# Patient Record
Sex: Female | Born: 1975 | Race: White | Hispanic: Yes | Marital: Single | Smoking: Never smoker
Health system: Southern US, Community
[De-identification: ages and names within clinical notes are randomized; demographics above are authoritative.]

## PROBLEM LIST (undated history)

## (undated) DIAGNOSIS — F32A Depression, unspecified: Secondary | ICD-10-CM

## (undated) DIAGNOSIS — K831 Obstruction of bile duct: Secondary | ICD-10-CM

## (undated) DIAGNOSIS — O26619 Liver and biliary tract disorders in pregnancy, unspecified trimester: Secondary | ICD-10-CM

## (undated) DIAGNOSIS — J302 Other seasonal allergic rhinitis: Secondary | ICD-10-CM

## (undated) DIAGNOSIS — D649 Anemia, unspecified: Secondary | ICD-10-CM

## (undated) DIAGNOSIS — O09219 Supervision of pregnancy with history of pre-term labor, unspecified trimester: Secondary | ICD-10-CM

## (undated) DIAGNOSIS — K219 Gastro-esophageal reflux disease without esophagitis: Secondary | ICD-10-CM

## (undated) DIAGNOSIS — E785 Hyperlipidemia, unspecified: Secondary | ICD-10-CM

## (undated) DIAGNOSIS — O26649 Intrahepatic cholestasis of pregnancy, unspecified trimester: Secondary | ICD-10-CM

## (undated) DIAGNOSIS — F329 Major depressive disorder, single episode, unspecified: Secondary | ICD-10-CM

## (undated) HISTORY — DX: Depression, unspecified: F32.A

## (undated) HISTORY — DX: Anemia, unspecified: D64.9

## (undated) HISTORY — DX: Major depressive disorder, single episode, unspecified: F32.9

## (undated) HISTORY — DX: Hyperlipidemia, unspecified: E78.5

## (undated) HISTORY — DX: Supervision of pregnancy with history of pre-term labor, unspecified trimester: O09.219

## (undated) HISTORY — DX: Intrahepatic cholestasis of pregnancy, unspecified trimester: O26.649

## (undated) HISTORY — DX: Obstruction of bile duct: K83.1

## (undated) HISTORY — DX: Gastro-esophageal reflux disease without esophagitis: K21.9

## (undated) HISTORY — DX: Liver and biliary tract disorders in pregnancy, unspecified trimester: O26.619

## (undated) HISTORY — PX: GYNECOLOGIC CRYOSURGERY: SHX857

---

## 2003-08-14 ENCOUNTER — Inpatient Hospital Stay (HOSPITAL_COMMUNITY): Admission: AD | Admit: 2003-08-14 | Discharge: 2003-08-14 | Payer: Self-pay | Admitting: *Deleted

## 2003-08-26 ENCOUNTER — Ambulatory Visit (HOSPITAL_COMMUNITY): Admission: RE | Admit: 2003-08-26 | Discharge: 2003-08-26 | Payer: Self-pay | Admitting: *Deleted

## 2003-11-02 ENCOUNTER — Inpatient Hospital Stay (HOSPITAL_COMMUNITY): Admission: AD | Admit: 2003-11-02 | Discharge: 2003-11-04 | Payer: Self-pay | Admitting: *Deleted

## 2003-11-05 ENCOUNTER — Encounter: Admission: RE | Admit: 2003-11-05 | Discharge: 2003-12-05 | Payer: Self-pay | Admitting: *Deleted

## 2009-07-31 ENCOUNTER — Emergency Department (HOSPITAL_COMMUNITY): Admission: EM | Admit: 2009-07-31 | Discharge: 2009-07-31 | Payer: Self-pay | Admitting: Emergency Medicine

## 2010-09-07 LAB — URINALYSIS, ROUTINE W REFLEX MICROSCOPIC
Bilirubin Urine: NEGATIVE
Glucose, UA: NEGATIVE mg/dL
Nitrite: NEGATIVE
Specific Gravity, Urine: 1.018 (ref 1.005–1.030)
pH: 6 (ref 5.0–8.0)

## 2010-09-07 LAB — URINE CULTURE

## 2010-09-07 LAB — CBC
HCT: 34.6 % — ABNORMAL LOW (ref 36.0–46.0)
Hemoglobin: 11.6 g/dL — ABNORMAL LOW (ref 12.0–15.0)
WBC: 11.3 10*3/uL — ABNORMAL HIGH (ref 4.0–10.5)

## 2010-09-07 LAB — COMPREHENSIVE METABOLIC PANEL
Alkaline Phosphatase: 71 U/L (ref 39–117)
BUN: 7 mg/dL (ref 6–23)
CO2: 25 mEq/L (ref 19–32)
Chloride: 103 mEq/L (ref 96–112)
Glucose, Bld: 87 mg/dL (ref 70–99)
Potassium: 3.9 mEq/L (ref 3.5–5.1)
Total Bilirubin: 0.5 mg/dL (ref 0.3–1.2)

## 2010-09-07 LAB — DIFFERENTIAL
Basophils Absolute: 0.1 10*3/uL (ref 0.0–0.1)
Basophils Relative: 1 % (ref 0–1)
Monocytes Absolute: 1 10*3/uL (ref 0.1–1.0)
Neutro Abs: 8.8 10*3/uL — ABNORMAL HIGH (ref 1.7–7.7)
Neutrophils Relative %: 78 % — ABNORMAL HIGH (ref 43–77)

## 2010-09-07 LAB — URINE MICROSCOPIC-ADD ON

## 2010-09-07 LAB — POCT PREGNANCY, URINE: Preg Test, Ur: NEGATIVE

## 2011-04-12 ENCOUNTER — Emergency Department (HOSPITAL_COMMUNITY)
Admission: EM | Admit: 2011-04-12 | Discharge: 2011-04-12 | Disposition: A | Discharge status: Home / Self Care | Payer: Self-pay | Attending: Emergency Medicine | Admitting: Emergency Medicine

## 2011-04-12 DIAGNOSIS — O99891 Other specified diseases and conditions complicating pregnancy: Secondary | ICD-10-CM | POA: Insufficient documentation

## 2011-04-12 DIAGNOSIS — J3489 Other specified disorders of nose and nasal sinuses: Secondary | ICD-10-CM | POA: Insufficient documentation

## 2011-04-12 DIAGNOSIS — J069 Acute upper respiratory infection, unspecified: Secondary | ICD-10-CM | POA: Insufficient documentation

## 2011-04-12 DIAGNOSIS — R059 Cough, unspecified: Secondary | ICD-10-CM | POA: Insufficient documentation

## 2011-04-12 DIAGNOSIS — R05 Cough: Secondary | ICD-10-CM | POA: Insufficient documentation

## 2011-06-19 NOTE — L&D Delivery Note (Signed)
Delivery Note At 4:46 AM a viable and healthy female was delivered via  (Presentation: ROA).  APGAR: , ; weight: pending Placenta status: intact Cord:  3 vessel  with the following complications: none.  Cord pH: n/a  Anesthesia:  none Episiotomy: none Lacerations: small 1st degree not requiring repair Suture Repair: none Est. Blood Loss (mL):  Mom to postpartum.  Baby to nursery-stable.  RIGBY, MICHAEL 11/30/2011, 5:09 AM    I was present for this delivery.  I agree with the above note.   Levie Heritage, DO 11/30/2011 5:13 AM

## 2011-07-02 ENCOUNTER — Other Ambulatory Visit (HOSPITAL_COMMUNITY): Payer: Self-pay | Admitting: Physician Assistant

## 2011-07-02 DIAGNOSIS — Z3689 Encounter for other specified antenatal screening: Secondary | ICD-10-CM

## 2011-07-02 LAB — OB RESULTS CONSOLE HIV ANTIBODY (ROUTINE TESTING): HIV: NONREACTIVE

## 2011-07-02 LAB — OB RESULTS CONSOLE GC/CHLAMYDIA
Chlamydia: NEGATIVE
Gonorrhea: NEGATIVE

## 2011-07-02 LAB — OB RESULTS CONSOLE RPR: RPR: NONREACTIVE

## 2011-07-06 ENCOUNTER — Ambulatory Visit (HOSPITAL_COMMUNITY)
Admission: RE | Admit: 2011-07-06 | Discharge: 2011-07-06 | Disposition: A | Discharge status: Home / Self Care | Payer: Medicaid Other | Source: Ambulatory Visit | Attending: Physician Assistant | Admitting: Physician Assistant

## 2011-07-06 DIAGNOSIS — O358XX0 Maternal care for other (suspected) fetal abnormality and damage, not applicable or unspecified: Secondary | ICD-10-CM | POA: Insufficient documentation

## 2011-07-06 DIAGNOSIS — Z3689 Encounter for other specified antenatal screening: Secondary | ICD-10-CM

## 2011-07-06 DIAGNOSIS — Z1389 Encounter for screening for other disorder: Secondary | ICD-10-CM | POA: Insufficient documentation

## 2011-07-06 DIAGNOSIS — Z363 Encounter for antenatal screening for malformations: Secondary | ICD-10-CM | POA: Insufficient documentation

## 2011-10-01 ENCOUNTER — Encounter: Payer: Self-pay | Admitting: Family Medicine

## 2011-10-01 ENCOUNTER — Ambulatory Visit: Payer: Self-pay | Admitting: Family Medicine

## 2011-10-01 VITALS — BP 110/73 | HR 81 | Temp 99.7°F | Resp 20 | Ht 64.0 in | Wt 184.8 lb

## 2011-10-01 DIAGNOSIS — J45909 Unspecified asthma, uncomplicated: Secondary | ICD-10-CM

## 2011-10-01 DIAGNOSIS — R062 Wheezing: Secondary | ICD-10-CM

## 2011-10-01 DIAGNOSIS — R0602 Shortness of breath: Secondary | ICD-10-CM

## 2011-10-01 MED ORDER — ALBUTEROL SULFATE HFA 108 (90 BASE) MCG/ACT IN AERS: 2.0000 | INHALATION_SPRAY | Freq: Four times a day (QID) | RESPIRATORY_TRACT | Status: DC | PRN

## 2011-10-01 MED ORDER — LORATADINE 10 MG PO TABS: 10.0000 mg | ORAL_TABLET | Freq: Every day | ORAL | Status: DC

## 2011-10-01 MED ORDER — ALBUTEROL SULFATE (5 MG/ML) 0.5% IN NEBU
2.5000 mg | INHALATION_SOLUTION | Freq: Once | RESPIRATORY_TRACT | Status: AC
  Administered 2011-10-01: 2.5 mg via RESPIRATORY_TRACT

## 2011-10-01 MED ORDER — FLUTICASONE PROPIONATE 50 MCG/ACT NA SUSP: 2.0000 | Freq: Every day | NASAL | Status: DC

## 2011-10-01 NOTE — Progress Notes (Signed)
  Subjective:    Patient ID: Kimberly Copeland, female    DOB: Oct 20, 1975, 36 y.o.   MRN: [id number]  HPI 36 yo female 32.[redacted] weeks pregnant with history of allergies and asthma.  SOB, wheezing, cough for 2-3 days.  Also has runny nose, watery eyes scratchy throat.  Has not tried any medication.   Normal fetal movement.  Has ob appt on Thursday   Review of Systems Negative except as per HPI     Objective:   Physical Exam  Constitutional: She appears well-developed. No distress.  HENT:  Right Ear: Tympanic membrane, external ear and ear canal normal. Tympanic membrane is not injected, not scarred, not perforated, not erythematous, not retracted and not bulging.  Left Ear: Tympanic membrane, external ear and ear canal normal. Tympanic membrane is not injected, not scarred, not perforated, not erythematous, not retracted and not bulging.  Nose: Mucosal edema and rhinorrhea present. Right sinus exhibits no maxillary sinus tenderness and no frontal sinus tenderness. Left sinus exhibits no maxillary sinus tenderness and no frontal sinus tenderness.  Mouth/Throat: Uvula is midline, oropharynx is clear and moist and mucous membranes are normal. No oropharyngeal exudate or tonsillar abscesses.  Eyes: Left conjunctiva is injected.  Cardiovascular: Normal rate, regular rhythm, normal heart sounds and intact distal pulses.   No murmur heard. Pulmonary/Chest: Effort normal. No respiratory distress. She has wheezes. She has no rales.       Wheezing greater on left, though is bilateral.   Lymphadenopathy:       Head (right side): No submandibular and no preauricular adenopathy present.       Head (left side): No submandibular and no preauricular adenopathy present.       Right cervical: No superficial cervical and no posterior cervical adenopathy present.      Left cervical: No superficial cervical and no posterior cervical adenopathy present.       Right: No supraclavicular adenopathy present.       Left:  No supraclavicular adenopathy present.  Skin: Skin is warm and dry.    Albuterol neb given.  Improved breath sounds, less wheezing.  Pulsox up to 99%.  FHT's: 125.        Assessment & Plan:  Allergic asthma - claritin, flonase, albuterol.  F/U with OB on Thursday

## 2011-10-01 NOTE — Patient Instructions (Signed)
Asma en el adulto (Asthma, Adult) El asma es causada por la contraccin del ancho de los conductos de aire en los pulmones. Puede desencadenarse debido al polen, el polvo, la piel o plumas de animales, el moho, algunos alimentos, las dificultades respiratorias, la exposicin al humo, el ejercicio, el estrs emocional u otros alrgenos (elementos que provocan reacciones alrgicas). La repeticin de los ataques es comn. INSTRUCCIONES PARA EL CUIDADO DOMICILIARIO  Tome los medicamentos recetados que le indic el profesional que lo asiste.   Evite el polen, el polvo, la piel o plumas de animales, el moho, el humo y otras cosas que puedan provocarle un ataque en el hogar o en el trabajo.   Puede que tenga menos ataques si disminuye el polvo en el hogar. Los purificadores de aire electrostticos pueden ayudar.   Puede ser de utilidad cubrir las almohadas o colchones con materiales que no tiendan a provocar alergias.   Converse con el profesional que lo asiste acerca del plan de accin que deber implementar para el manejo en el hogar de los ataques de asma que incluya un medidor de flujo mximo para medir la gravedad del ataque. Un plan de accin que lo ayude a minimizar o detener los ataques sin necesidad de solicitar atencin mdica.   Si no tiene restriccin para consumir lquidos, beba entre 8 y 10 vasos de agua por da.   Siempre tenga preparado un plan par solicitar atencin mdica si es necesario, y que incluya el llamado al mdico, el acceso a un centro de emergencias local y el llamado al 911 para los casos graves.   Converse con el profesional que lo asiste sobre posibles rutinas de ejercicio fsico que pudiera practicar.   Si las plumas o el pelo de un animal son el motivo del asma, deber deshacerse de las mascotas.  SOLICITE ATENCIN MDICA SI:  Respira con dificultad an cuando toma la medicina para prevenir los ataques.   Siente dolores musculares, dolor en el pecho o espesamiento  del esputo.   Su esputo cambia de un color claro o blanco a un color amarillo, verde, gris o sanguinolento.   Tiene trastornos ocasionados por la medicina que est tomando (como urticaria, picazn, hinchazn o dificultades respiratorias).  SOLICITE ATENCIN MDICA DE INMEDIATO SI:  Su medicamento habitual no detiene el resuello o si le aumenta la tos o le falta el aliento.   Tiene una creciente dificultad para respirar.   Tiene fiebre.  EST SEGURO QUE:  Comprende las instrucciones para el alta mdica.   Controlar su enfermedad.   Solicitar atencin mdica de inmediato segn las indicaciones.  Document Released: 06/04/2005 Document Revised: 05/24/2011 [employer] Patient Information 2012 [employer]. 

## 2011-10-11 ENCOUNTER — Emergency Department (HOSPITAL_COMMUNITY)
Admission: EM | Admit: 2011-10-11 | Discharge: 2011-10-12 | Disposition: A | Discharge status: Home / Self Care | Payer: Self-pay | Attending: Emergency Medicine | Admitting: Emergency Medicine

## 2011-10-11 ENCOUNTER — Emergency Department (HOSPITAL_COMMUNITY): Payer: Self-pay

## 2011-10-11 ENCOUNTER — Encounter (HOSPITAL_COMMUNITY): Payer: Self-pay | Admitting: Adult Health

## 2011-10-11 DIAGNOSIS — O99891 Other specified diseases and conditions complicating pregnancy: Secondary | ICD-10-CM | POA: Insufficient documentation

## 2011-10-11 DIAGNOSIS — K838 Other specified diseases of biliary tract: Secondary | ICD-10-CM | POA: Insufficient documentation

## 2011-10-11 DIAGNOSIS — K7689 Other specified diseases of liver: Secondary | ICD-10-CM

## 2011-10-11 DIAGNOSIS — L299 Pruritus, unspecified: Secondary | ICD-10-CM | POA: Insufficient documentation

## 2011-10-11 DIAGNOSIS — R21 Rash and other nonspecific skin eruption: Secondary | ICD-10-CM | POA: Insufficient documentation

## 2011-10-11 DIAGNOSIS — Z79899 Other long term (current) drug therapy: Secondary | ICD-10-CM | POA: Insufficient documentation

## 2011-10-11 HISTORY — DX: Other seasonal allergic rhinitis: J30.2

## 2011-10-11 LAB — COMPREHENSIVE METABOLIC PANEL
Albumin: 3.1 g/dL — ABNORMAL LOW (ref 3.5–5.2)
Alkaline Phosphatase: 247 U/L — ABNORMAL HIGH (ref 39–117)
BUN: 7 mg/dL (ref 6–23)
Potassium: 3.8 mEq/L (ref 3.5–5.1)
Total Protein: 7.2 g/dL (ref 6.0–8.3)

## 2011-10-11 LAB — URINALYSIS, ROUTINE W REFLEX MICROSCOPIC
Glucose, UA: NEGATIVE mg/dL
Ketones, ur: NEGATIVE mg/dL
Leukocytes, UA: NEGATIVE
Specific Gravity, Urine: 1.015 (ref 1.005–1.030)
pH: 6 (ref 5.0–8.0)

## 2011-10-11 MED ORDER — HYDROXYZINE HCL 25 MG PO TABS: 50.0000 mg | ORAL_TABLET | Freq: Once | ORAL | Status: AC

## 2011-10-11 MED ORDER — HYDROXYZINE HCL 25 MG PO TABS
25.0000 mg | ORAL_TABLET | Freq: Once | ORAL | Status: AC
  Administered 2011-10-11: 25 mg via ORAL
  Filled 2011-10-11: qty 1

## 2011-10-11 NOTE — ED Provider Notes (Signed)
History     CSN: [id number]  Arrival date & time 10/11/11  1607   First MD Initiated Contact with Patient 10/11/11 1750     6:24 PM HPI Patient is a 36 year old female presenting with extreme pruritus. Send history obtained from patient states that she began itching last night about 10 PM. Denies a rash to her body but cannot stop scratching. Denies fever, headache, neck pain, abdominal pain, chest pain, shortness of breath, throat irritation, swelling. Denies any complications in pregnancy thus far. Denies changes in detergents or soaps, changes in medication, known allergies, or insect bites. The history is provided by the patient. The history is limited by a language barrier (son is translating).    Past Medical History  Diagnosis Date  . Seasonal allergies     History reviewed. No pertinent past surgical history.  History reviewed. No pertinent family history.  History  Substance Use Topics  . Smoking status: Never Smoker   . Smokeless tobacco: Not on file  . Alcohol Use: No    OB History    Grav Para Term Preterm Abortions TAB SAB Ect Mult Living   1               Review of Systems  Constitutional: Negative for fever and chills.  HENT: Negative for rhinorrhea.   Eyes: Negative for redness.  Respiratory: Negative for cough, shortness of breath and wheezing.   Cardiovascular: Negative for chest pain and palpitations.  Gastrointestinal: Negative for nausea.  Skin: Negative for rash.       Pruritus  All other systems reviewed and are negative.    Allergies  Review of patient's allergies indicates no known allergies.  Home Medications   Current Outpatient Rx  Name Route Sig Dispense Refill  . ALBUTEROL SULFATE HFA 108 (90 BASE) MCG/ACT IN AERS Inhalation Inhale 2 puffs into the lungs every 6 (six) hours as needed for wheezing. 1 Inhaler 0  . LORATADINE 10 MG PO TABS Oral Take 1 tablet (10 mg total) by mouth daily. 30 tablet 11    BP 119/75  Pulse 86   Temp(Src) 97.6 F (36.4 C) (Oral)  Resp 16  SpO2 97%  Physical Exam  Vitals reviewed. Constitutional: She is oriented to person, place, and time. Vital signs are normal. She appears well-developed and well-nourished. No distress.  HENT:  Head: Normocephalic and atraumatic.  Mouth/Throat: Oropharynx is clear and moist. No oropharyngeal exudate.  Eyes: Pupils are equal, round, and reactive to light.  Neck: Neck supple.  Pulmonary/Chest: Effort normal. She has no wheezes. She has no rales. She exhibits no tenderness.  Abdominal: Bowel sounds are normal. She exhibits no mass. There is no tenderness. There is no rebound and no guarding.       Gravid  Neurological: She is alert and oriented to person, place, and time.  Skin: Skin is warm and dry. No rash noted. No erythema. No pallor.  Psychiatric: She has a normal mood and affect. Her behavior is normal.    ED Course  Procedures  Results for orders placed during the hospital encounter of 10/11/11  COMPREHENSIVE METABOLIC PANEL      Component Value Range   Sodium 134 (*) 135 - 145 (mEq/L)   Potassium 3.8  3.5 - 5.1 (mEq/L)   Chloride 102  96 - 112 (mEq/L)   CO2 21  19 - 32 (mEq/L)   Glucose, Bld 74  70 - 99 (mg/dL)   BUN 7  6 - 23 (mg/dL)  Creatinine, Ser 0.62  0.50 - 1.10 (mg/dL)   Calcium 9.5  8.4 - 84.1 (mg/dL)   Total Protein 7.2  6.0 - 8.3 (g/dL)   Albumin 3.1 (*) 3.5 - 5.2 (g/dL)   AST 71 (*) 0 - 37 (U/L)   ALT 66 (*) 0 - 35 (U/L)   Alkaline Phosphatase 247 (*) 39 - 117 (U/L)   Total Bilirubin 0.4  0.3 - 1.2 (mg/dL)   GFR calc non Af Amer >90  >90 (mL/min)   GFR calc Af Amer >90  >90 (mL/min)  URINALYSIS, ROUTINE W REFLEX MICROSCOPIC      Component Value Range   Color, Urine YELLOW  YELLOW    APPearance CLOUDY (*) CLEAR    Specific Gravity, Urine 1.015  1.005 - 1.030    pH 6.0  5.0 - 8.0    Glucose, UA NEGATIVE  NEGATIVE (mg/dL)   Hgb urine dipstick NEGATIVE  NEGATIVE    Bilirubin Urine NEGATIVE  NEGATIVE     Ketones, ur NEGATIVE  NEGATIVE (mg/dL)   Protein, ur NEGATIVE  NEGATIVE (mg/dL)   Urobilinogen, UA 1.0  0.0 - 1.0 (mg/dL)   Nitrite NEGATIVE  NEGATIVE    Leukocytes, UA NEGATIVE  NEGATIVE    US Abdomen Complete  10/11/2011  *RADIOLOGY REPORT*  Clinical Data:  Elevated liver function tests, [redacted] weeks pregnant  ABDOMINAL ULTRASOUND COMPLETE  Comparison:  CT 07/31/2009  Findings:  Gallbladder:  Multiple mobile gallstones noted, largest 7 mm.  No gallbladder wall thickening, pericholecystic fluid, or sonographic Murphy's sign.  Common Bile Duct:  Within normal limits in caliber.  Liver: No focal mass lesion identified.  Within normal limits in parenchymal echogenicity.  IVC:  Appears normal.  Pancreas:  Obscured by bowel gas.  Spleen:  Within normal limits in size and echotexture.  Right kidney:  Normal in size.  Minimal fullness of the renal pelvis is compatible with the patient's gravid state.  Left kidney:  Normal in size and parenchymal echogenicity.  No evidence of mass or hydronephrosis.  Abdominal Aorta:  No aneurysm identified.  IMPRESSION: Gallstones without other secondary sonographic evidence for acute cholecystitis.  Original Report Authenticated By: Harrel Lemon, M.D.     MDM   Suspect patient has intrahepatic cholestasis. However do to elevated LFTs and alkaline phosphatase will obtain an ultrasound to ensure no gallbladder or liver disease. Assuming ultrasound will be negative we'll likely discharge and advised close followup with OB/GYN for further evaluation.   11:01 PM Patient still pending ultrasound abdomen. A person called ultrasound consult and they state that they are approximately one hour away. I discussed the plan with patient and family and they agree with plan.  12:00 AM Patient's x-ray indicates gallstones but not cholecystitis. Will discharge home with hydroxyzine and advised close followup with OB/GYN. Discussed finding of cholelithiasis. Patient and family was  understanding and are ready for discharge.  Thomasene Lot, PA-C 10/12/11 0001

## 2011-10-11 NOTE — Discharge Instructions (Signed)
Is very important to see your doctor within the next couple of days. Please let them know that you likely have a diagnosis of intrahepatic cholestasis and require close followup

## 2011-10-11 NOTE — ED Notes (Signed)
8 months pregnant, c/o itching and rash since last night at 10 pm. Denies difficulty breathing, denies SOB. C/o feeling hot. Redness noted to hands, arms and abdomen, redness to eyes.

## 2011-10-11 NOTE — ED Notes (Signed)
Denies pain at this time. Complaining of itching. States the rash started last night

## 2011-10-15 NOTE — ED Provider Notes (Signed)
Medical screening examination/treatment/procedure(s) were performed by non-physician practitioner and as supervising physician I was immediately available for consultation/collaboration.  Ethelda Chick, MD 10/15/11 [phone]

## 2011-10-18 ENCOUNTER — Other Ambulatory Visit (HOSPITAL_COMMUNITY): Payer: Self-pay | Admitting: Family

## 2011-10-18 DIAGNOSIS — K7689 Other specified diseases of liver: Secondary | ICD-10-CM

## 2011-10-19 ENCOUNTER — Encounter (HOSPITAL_COMMUNITY): Payer: Self-pay | Admitting: *Deleted

## 2011-10-19 ENCOUNTER — Telehealth (HOSPITAL_COMMUNITY): Payer: Self-pay | Admitting: *Deleted

## 2011-10-19 NOTE — Telephone Encounter (Signed)
Preadmission screen [id number] Translator number

## 2011-10-19 NOTE — Telephone Encounter (Signed)
Preadmission screen  

## 2011-10-19 NOTE — Telephone Encounter (Signed)
Preadmission screen Interpreter number [id number], clarifying POC regarding liver and gall bladder, pt thought she had been told she had kidney problems or her appendix.  Verified with health dept and informed pt of elevated liver enzymes and gall stones present being the reason for induction of labor.

## 2011-10-22 ENCOUNTER — Ambulatory Visit (HOSPITAL_COMMUNITY)
Admission: RE | Admit: 2011-10-22 | Discharge: 2011-10-22 | Disposition: A | Discharge status: Home / Self Care | Payer: Medicaid Other | Source: Ambulatory Visit | Attending: Family | Admitting: Family

## 2011-10-22 DIAGNOSIS — O26619 Liver and biliary tract disorders in pregnancy, unspecified trimester: Secondary | ICD-10-CM | POA: Insufficient documentation

## 2011-10-22 DIAGNOSIS — Z3689 Encounter for other specified antenatal screening: Secondary | ICD-10-CM | POA: Insufficient documentation

## 2011-10-22 DIAGNOSIS — K7689 Other specified diseases of liver: Secondary | ICD-10-CM

## 2011-10-25 ENCOUNTER — Other Ambulatory Visit (HOSPITAL_COMMUNITY): Payer: Self-pay | Admitting: Family

## 2011-10-25 DIAGNOSIS — K7689 Other specified diseases of liver: Secondary | ICD-10-CM

## 2011-10-29 ENCOUNTER — Ambulatory Visit (HOSPITAL_COMMUNITY)
Admission: RE | Admit: 2011-10-29 | Discharge: 2011-10-29 | Disposition: A | Discharge status: Home / Self Care | Payer: Medicaid Other | Source: Ambulatory Visit | Attending: Family | Admitting: Family

## 2011-10-29 DIAGNOSIS — Z3689 Encounter for other specified antenatal screening: Secondary | ICD-10-CM | POA: Insufficient documentation

## 2011-10-29 DIAGNOSIS — K7689 Other specified diseases of liver: Secondary | ICD-10-CM

## 2011-10-30 ENCOUNTER — Ambulatory Visit (HOSPITAL_COMMUNITY): Payer: Medicaid Other

## 2011-11-05 ENCOUNTER — Other Ambulatory Visit (HOSPITAL_COMMUNITY): Payer: Self-pay | Admitting: Physician Assistant

## 2011-11-05 ENCOUNTER — Inpatient Hospital Stay (HOSPITAL_COMMUNITY): Admission: RE | Admit: 2011-11-05 | Payer: Medicaid Other | Source: Ambulatory Visit

## 2011-11-05 DIAGNOSIS — O365990 Maternal care for other known or suspected poor fetal growth, unspecified trimester, not applicable or unspecified: Secondary | ICD-10-CM

## 2011-11-06 ENCOUNTER — Ambulatory Visit (HOSPITAL_COMMUNITY)
Admission: RE | Admit: 2011-11-06 | Discharge: 2011-11-06 | Disposition: A | Discharge status: Home / Self Care | Payer: Medicaid Other | Source: Ambulatory Visit | Attending: Physician Assistant | Admitting: Physician Assistant

## 2011-11-06 DIAGNOSIS — Z3689 Encounter for other specified antenatal screening: Secondary | ICD-10-CM | POA: Insufficient documentation

## 2011-11-06 DIAGNOSIS — O365990 Maternal care for other known or suspected poor fetal growth, unspecified trimester, not applicable or unspecified: Secondary | ICD-10-CM

## 2011-11-26 ENCOUNTER — Other Ambulatory Visit (HOSPITAL_COMMUNITY): Payer: Self-pay | Admitting: Nurse Practitioner

## 2011-11-26 DIAGNOSIS — O480 Post-term pregnancy: Secondary | ICD-10-CM

## 2011-11-29 ENCOUNTER — Ambulatory Visit (HOSPITAL_COMMUNITY)
Admission: RE | Admit: 2011-11-29 | Discharge: 2011-11-29 | Disposition: A | Discharge status: Home / Self Care | Payer: Self-pay | Source: Ambulatory Visit | Attending: Nurse Practitioner | Admitting: Nurse Practitioner

## 2011-11-29 DIAGNOSIS — Z3689 Encounter for other specified antenatal screening: Secondary | ICD-10-CM | POA: Insufficient documentation

## 2011-11-29 DIAGNOSIS — O480 Post-term pregnancy: Secondary | ICD-10-CM | POA: Insufficient documentation

## 2011-11-30 ENCOUNTER — Encounter (HOSPITAL_COMMUNITY): Payer: Self-pay | Admitting: *Deleted

## 2011-11-30 ENCOUNTER — Inpatient Hospital Stay (HOSPITAL_COMMUNITY)
Admission: AD | Admit: 2011-11-30 | Discharge: 2011-12-01 | DRG: 775 | Disposition: A | Discharge status: Home / Self Care | Payer: Medicaid Other | Source: Ambulatory Visit | Attending: Obstetrics & Gynecology | Admitting: Obstetrics & Gynecology

## 2011-11-30 DIAGNOSIS — IMO0001 Reserved for inherently not codable concepts without codable children: Secondary | ICD-10-CM

## 2011-11-30 DIAGNOSIS — O26899 Other specified pregnancy related conditions, unspecified trimester: Principal | ICD-10-CM | POA: Diagnosis present

## 2011-11-30 DIAGNOSIS — K8020 Calculus of gallbladder without cholecystitis without obstruction: Secondary | ICD-10-CM

## 2011-11-30 LAB — ABO/RH: ABO/RH(D): O NEG

## 2011-11-30 LAB — CBC
Hemoglobin: 13.8 g/dL (ref 12.0–15.0)
MCH: 28.7 pg (ref 26.0–34.0)
MCHC: 34 g/dL (ref 30.0–36.0)
MCV: 84.4 fL (ref 78.0–100.0)
RBC: 4.81 MIL/uL (ref 3.87–5.11)

## 2011-11-30 MED ORDER — PRENATAL MULTIVITAMIN CH
1.0000 | ORAL_TABLET | Freq: Every day | ORAL | Status: DC
  Administered 2011-11-30 – 2011-12-01 (×2): 1 via ORAL
  Filled 2011-11-30 (×2): qty 1

## 2011-11-30 MED ORDER — CITRIC ACID-SODIUM CITRATE 334-500 MG/5ML PO SOLN: 30.0000 mL | ORAL | Status: DC | PRN

## 2011-11-30 MED ORDER — SENNOSIDES-DOCUSATE SODIUM 8.6-50 MG PO TABS
2.0000 | ORAL_TABLET | Freq: Every day | ORAL | Status: DC
  Administered 2011-11-30: 2 via ORAL

## 2011-11-30 MED ORDER — DIBUCAINE 1 % RE OINT: 1.0000 "application " | TOPICAL_OINTMENT | RECTAL | Status: DC | PRN

## 2011-11-30 MED ORDER — OXYTOCIN 20 UNITS IN LACTATED RINGERS INFUSION - SIMPLE
125.0000 mL/h | Freq: Once | INTRAVENOUS | Status: AC
  Administered 2011-11-30: 125 mL/h via INTRAVENOUS

## 2011-11-30 MED ORDER — IBUPROFEN 600 MG PO TABS
600.0000 mg | ORAL_TABLET | Freq: Four times a day (QID) | ORAL | Status: DC
  Administered 2011-11-30 – 2011-12-01 (×5): 600 mg via ORAL
  Filled 2011-11-30 (×5): qty 1

## 2011-11-30 MED ORDER — LIDOCAINE HCL (PF) 1 % IJ SOLN
30.0000 mL | INTRAMUSCULAR | Status: DC | PRN
  Filled 2011-11-30: qty 30

## 2011-11-30 MED ORDER — NALBUPHINE SYRINGE 5 MG/0.5 ML: 10.0000 mg | INJECTION | INTRAMUSCULAR | Status: DC | PRN

## 2011-11-30 MED ORDER — ONDANSETRON HCL 4 MG PO TABS: 4.0000 mg | ORAL_TABLET | ORAL | Status: DC | PRN

## 2011-11-30 MED ORDER — FLEET ENEMA 7-19 GM/118ML RE ENEM: 1.0000 | ENEMA | RECTAL | Status: DC | PRN

## 2011-11-30 MED ORDER — IBUPROFEN 600 MG PO TABS
600.0000 mg | ORAL_TABLET | Freq: Four times a day (QID) | ORAL | Status: DC | PRN
  Administered 2011-11-30: 600 mg via ORAL
  Filled 2011-11-30: qty 1

## 2011-11-30 MED ORDER — ONDANSETRON HCL 4 MG/2ML IJ SOLN: 4.0000 mg | Freq: Four times a day (QID) | INTRAMUSCULAR | Status: DC | PRN

## 2011-11-30 MED ORDER — OXYCODONE-ACETAMINOPHEN 5-325 MG PO TABS
1.0000 | ORAL_TABLET | ORAL | Status: DC | PRN
  Administered 2011-11-30: 2 via ORAL
  Filled 2011-11-30: qty 2

## 2011-11-30 MED ORDER — BENZOCAINE-MENTHOL 20-0.5 % EX AERO: 1.0000 "application " | INHALATION_SPRAY | CUTANEOUS | Status: DC | PRN

## 2011-11-30 MED ORDER — LACTATED RINGERS IV SOLN: INTRAVENOUS | Status: DC

## 2011-11-30 MED ORDER — TETANUS-DIPHTH-ACELL PERTUSSIS 5-2.5-18.5 LF-MCG/0.5 IM SUSP
0.5000 mL | Freq: Once | INTRAMUSCULAR | Status: AC
  Administered 2011-12-01: 0.5 mL via INTRAMUSCULAR
  Filled 2011-11-30 (×2): qty 0.5

## 2011-11-30 MED ORDER — LORATADINE 10 MG PO TABS
10.0000 mg | ORAL_TABLET | Freq: Every day | ORAL | Status: DC
  Administered 2011-11-30 – 2011-12-01 (×2): 10 mg via ORAL
  Filled 2011-11-30 (×3): qty 1

## 2011-11-30 MED ORDER — LANOLIN HYDROUS EX OINT: TOPICAL_OINTMENT | CUTANEOUS | Status: DC | PRN

## 2011-11-30 MED ORDER — LACTATED RINGERS IV SOLN: 500.0000 mL | INTRAVENOUS | Status: DC | PRN

## 2011-11-30 MED ORDER — OXYCODONE-ACETAMINOPHEN 5-325 MG PO TABS: 1.0000 | ORAL_TABLET | ORAL | Status: DC | PRN

## 2011-11-30 MED ORDER — ACETAMINOPHEN 325 MG PO TABS: 650.0000 mg | ORAL_TABLET | ORAL | Status: DC | PRN

## 2011-11-30 MED ORDER — DIPHENHYDRAMINE HCL 25 MG PO CAPS: 25.0000 mg | ORAL_CAPSULE | Freq: Four times a day (QID) | ORAL | Status: DC | PRN

## 2011-11-30 MED ORDER — SIMETHICONE 80 MG PO CHEW: 80.0000 mg | CHEWABLE_TABLET | ORAL | Status: DC | PRN

## 2011-11-30 MED ORDER — OXYTOCIN BOLUS FROM INFUSION
500.0000 mL | Freq: Once | INTRAVENOUS | Status: DC
  Filled 2011-11-30: qty 1000
  Filled 2011-11-30: qty 500

## 2011-11-30 MED ORDER — WITCH HAZEL-GLYCERIN EX PADS: 1.0000 "application " | MEDICATED_PAD | CUTANEOUS | Status: DC | PRN

## 2011-11-30 MED ORDER — ONDANSETRON HCL 4 MG/2ML IJ SOLN: 4.0000 mg | INTRAMUSCULAR | Status: DC | PRN

## 2011-11-30 MED ORDER — ZOLPIDEM TARTRATE 5 MG PO TABS: 5.0000 mg | ORAL_TABLET | Freq: Every evening | ORAL | Status: DC | PRN

## 2011-11-30 NOTE — H&P (Signed)
Kimberly Copeland is a 36 y.o. female [phone] @[redacted]w[redacted]d  pt of [facility] presenting for labor check.  She is brought from lobby by wheelchair and is breathing with contractions upon arrival.    She has asthma treated with PRN albuterol, cholelithiasis in pregnancy and is taking Vistaril for itching. Her LFTs were elevated in April but normal LFTs in May.  Prenatal labs: ABO, Rh: O/Negative/-- (01/14 0000) Antibody: Negative (01/14 0000) Rubella: Immune (01/14 0000) RPR: Nonreactive (01/14 0000)  HBsAg: Negative (01/14 0000)  HIV: Non-reactive (01/14 0000)  GBS:   Negative 10/22/11  Maternal Medical History:  Reason for admission: Reason for admission: contractions.  Reason for Admission:   nauseaContractions: Onset was 3-5 hours ago.   Frequency: regular.   Duration is approximately 1 minute.   Perceived severity is strong.    Fetal activity: Perceived fetal activity is normal.   Last perceived fetal movement was within the past hour.    Prenatal complications: Gallstones in pregnancy Itching treated with Vistaril  Prenatal Complications - Diabetes: none.    OB History    Grav Para Term Preterm Abortions TAB SAB Ect Mult Living   5 3 3  1  1   3      Past Medical History  Diagnosis Date  . Seasonal allergies   . History of precipitous labor and deliveries, antepartum   . Intrahepatic cholestasis of pregnancy   . Anemia    Past Surgical History  Procedure Date  . Gynecologic cryosurgery    Family History: family history includes Anemia in her maternal grandmother; Hypertension in her mother; and Peripheral vascular disease in her maternal grandfather, maternal grandmother, and mother. Social History:  reports that she has never smoked. She has never used smokeless tobacco. She reports that she does not drink alcohol or use illicit drugs.  Review of Systems  Constitutional: Negative for fever, chills and malaise/fatigue.  Eyes: Negative for blurred vision.  Respiratory: Negative  for cough and shortness of breath.   Cardiovascular: Negative for chest pain.  Gastrointestinal: Positive for abdominal pain. Negative for heartburn, nausea and vomiting.  Genitourinary: Negative for dysuria, urgency and frequency.  Musculoskeletal: Negative.   Neurological: Negative for dizziness and headaches.  Psychiatric/Behavioral: Negative for depression.    Dilation: 6 Effacement (%): 90 Pulse 70, temperature 98.2 F (36.8 C), temperature source Oral, resp. rate 18, last menstrual period 02/19/2011. Maternal Exam:  Uterine Assessment: Contraction strength is firm.  Contraction duration is 70 seconds. Contraction frequency is regular.   Abdomen: Patient reports no abdominal tenderness. Estimated fetal weight is 8 lbs by Leopolds.   Fetal presentation: vertex  Pelvis: adequate for delivery.   Cervix: Cervix evaluated by digital exam.     Fetal Exam Fetal Monitor Review: Mode: ultrasound.   Baseline rate: 140.  Variability: moderate (6-25 bpm).   Pattern: no accelerations.    Fetal State Assessment: Category I - tracings are normal.     Physical Exam  Nursing note and vitals reviewed. Constitutional: She is oriented to person, place, and time. She appears well-developed and well-nourished.  Neck: Normal range of motion.  Cardiovascular: Normal rate, regular rhythm and normal heart sounds.   Respiratory: Effort normal.  GI: Soft.  Genitourinary:       5.5/90/-2, vertex  Musculoskeletal: Normal range of motion.  Neurological: She is alert and oriented to person, place, and time. She has normal reflexes.  Skin: Skin is warm and dry.  Psychiatric: She has a normal mood and affect. Her behavior is normal.  Judgment and thought content normal.     Assessment/Plan: Active labor GBS negative   Admit to birthing suites Anticipate NSVD   LEFTWICH-KIRBY, [name] 11/30/2011, 3:49 AM

## 2011-11-30 NOTE — Progress Notes (Signed)
UR chart review completed.  

## 2011-12-01 MED ORDER — RHO D IMMUNE GLOBULIN 1500 UNIT/2ML IJ SOLN
300.0000 ug | Freq: Once | INTRAMUSCULAR | Status: AC
  Administered 2011-12-01: 300 ug via INTRAMUSCULAR
  Filled 2011-12-01: qty 2

## 2011-12-01 MED ORDER — IBUPROFEN 600 MG PO TABS: 600.0000 mg | ORAL_TABLET | Freq: Four times a day (QID) | ORAL | Status: AC

## 2011-12-01 NOTE — Discharge Summary (Addendum)
Obstetric Discharge Summary Reason for Admission: onset of labor Prenatal Procedures: none Intrapartum Procedures: spontaneous vaginal delivery Postpartum Procedures: none Complications-Operative and Postpartum: 1st degree perineal laceration Hemoglobin  Date Value Range Status  11/30/2011 13.8  12.0 - 15.0 g/dL Final     HCT  Date Value Range Status  11/30/2011 40.6  36.0 - 46.0 % Final    Physical Exam:  General: alert, cooperative, appears stated age and no distress Lochia: appropriate Uterine Fundus: firm, abdominal binder in place DVT Evaluation: No evidence of DVT seen on physical exam. Negative Homan's sign. No cords or calf tenderness.  Discharge Diagnoses: Term Pregnancy-delivered  Discharge Information: Date: 12/01/2011 Activity: pelvic rest Diet: routine Medications: Ibuprofen Condition: stable Instructions: refer to practice specific booklet Discharge to: home Follow-up Information    Follow up in 6 weeks. (Health Department)          Newborn Data: Live born female  Birth Weight: 7 lb 3.3 oz (3270 g) APGAR: 8, 9  Home with mother  Plans to breast feed.  IUD for contraception.  RIGBY, MICHAEL 12/01/2011, 8:33 AM

## 2011-12-01 NOTE — Discharge Summary (Signed)
I examined pt and agree with documentation above and resident plan of care. Kimberly Copeland,Kimberly Copeland  

## 2011-12-01 NOTE — Discharge Instructions (Signed)
Parto vaginal °Cuidados luego °(Vaginal Delivery, Care After) °· Cambie el apósito cada vez que vaya al baño.  °· Límpiese suavemente con un papel tisú durante su estadía en el hospital, y siempre hágalo desde adelante hacia atrás. Puede utilizar un rociador con agua caliente del grifo o el tisú descartable, o ambos.  °· Coloque el apósito y el tisú sucios en el cesto de basura del baño, en la bolsa plástica de color rojo.  °· Durante su permanencia en el hospital, guarde los coágulos. Si elimina un coágulo, por favor no tire la cadena. También, si su flujo vaginal le parece excesivo, notifíquelo al personal de enfermería.  °· La primera vez que se levante de la cama después del parto, espere la ayuda de la enfermera. No se levante sola en ningún momento si se siente débil o mareada.  °· Flexione y extienda los tobillos vigorosamente, de modo que pueda sentir que las pantorrillas se endurecen, media docena de veces por hora, cuando está en la cama y despierta.  °· No se siente sobre un pie suyo ni deje las piernas colgando del borde de la cama ni mantenga una posición que dificulte la circulación de las piernas.  °· Muchas mujeres experimentan dolores de entuerto (contracciones uterinas leves) en los dos o tres días después del parto. Pídale a la enfermera un medicamento contra el dolor si lo necesita. Algunas veces la alimentación a pecho estimula los "entuertos"; si le ocurre esto, pida la medicación ½ - ¾ de hora antes de la próxima vez que alimente al bebé.  °· Para su protección y la de su bebé, le pedimos que no traspase las puertas dobles de la entrada de la unidad de obstetricia. No lleve al bebé en brazos por el corredor. Cuando saque o entre al bebé de la habitación, por favor colóquelo en el cochecito y empújelo.  °· Las mamás pueden tener a sus bebés en la habitación todo lo que deseen.  °Document Released: 06/04/2005 Document Revised: 05/24/2011 °Exit[employer]® Patient Information ©2012 [employer]. °

## 2011-12-02 LAB — RH IG WORKUP (INCLUDES ABO/RH): Gestational Age(Wks): 40.4

## 2011-12-06 ENCOUNTER — Inpatient Hospital Stay (HOSPITAL_COMMUNITY): Admission: RE | Admit: 2011-12-06 | Payer: Medicaid Other | Source: Ambulatory Visit

## 2012-04-30 ENCOUNTER — Encounter (HOSPITAL_COMMUNITY): Payer: Self-pay | Admitting: *Deleted

## 2012-04-30 ENCOUNTER — Emergency Department (HOSPITAL_COMMUNITY): Payer: Medicaid Other

## 2012-04-30 ENCOUNTER — Emergency Department (HOSPITAL_COMMUNITY)
Admission: EM | Admit: 2012-04-30 | Discharge: 2012-04-30 | Disposition: A | Discharge status: Home / Self Care | Payer: Medicaid Other | Attending: Emergency Medicine | Admitting: Emergency Medicine

## 2012-04-30 ENCOUNTER — Other Ambulatory Visit: Payer: Self-pay

## 2012-04-30 DIAGNOSIS — R12 Heartburn: Secondary | ICD-10-CM | POA: Insufficient documentation

## 2012-04-30 DIAGNOSIS — D649 Anemia, unspecified: Secondary | ICD-10-CM | POA: Insufficient documentation

## 2012-04-30 DIAGNOSIS — J309 Allergic rhinitis, unspecified: Secondary | ICD-10-CM | POA: Insufficient documentation

## 2012-04-30 DIAGNOSIS — R1013 Epigastric pain: Secondary | ICD-10-CM

## 2012-04-30 DIAGNOSIS — R110 Nausea: Secondary | ICD-10-CM | POA: Insufficient documentation

## 2012-04-30 LAB — URINALYSIS, ROUTINE W REFLEX MICROSCOPIC
Leukocytes, UA: NEGATIVE
Nitrite: NEGATIVE
Specific Gravity, Urine: 1.029 (ref 1.005–1.030)
Urobilinogen, UA: 0.2 mg/dL (ref 0.0–1.0)

## 2012-04-30 LAB — BASIC METABOLIC PANEL
BUN: 17 mg/dL (ref 6–23)
GFR calc Af Amer: >90 mL/min (ref >90)
GFR calc non Af Amer: 89 mL/min — ABNORMAL LOW (ref >90)
Potassium: 3.7 mEq/L (ref 3.5–5.1)
Sodium: 139 mEq/L (ref 135–145)

## 2012-04-30 LAB — CBC WITH DIFFERENTIAL/PLATELET
Basophils Relative: 0 % (ref 0–1)
Hemoglobin: 13.4 g/dL (ref 12.0–15.0)
MCHC: 34.6 g/dL (ref 30.0–36.0)
Monocytes Relative: 6 % (ref 3–12)
Neutro Abs: 6.5 10*3/uL (ref 1.7–7.7)
Neutrophils Relative %: 65 % (ref 43–77)
RBC: 4.72 MIL/uL (ref 3.87–5.11)

## 2012-04-30 LAB — HEPATIC FUNCTION PANEL
ALT: 23 U/L (ref 0–35)
Total Protein: 7.7 g/dL (ref 6.0–8.3)

## 2012-04-30 LAB — LIPASE, BLOOD: Lipase: 39 U/L (ref 11–59)

## 2012-04-30 MED ORDER — OMEPRAZOLE 20 MG PO CPDR: 20.0000 mg | DELAYED_RELEASE_CAPSULE | Freq: Every day | ORAL | Status: DC

## 2012-04-30 MED ORDER — FAMOTIDINE 20 MG PO TABS: 20.0000 mg | ORAL_TABLET | Freq: Two times a day (BID) | ORAL | Status: DC

## 2012-04-30 MED ORDER — GI COCKTAIL ~~LOC~~
30.0000 mL | Freq: Once | ORAL | Status: AC
  Administered 2012-04-30: 30 mL via ORAL
  Filled 2012-04-30: qty 30

## 2012-04-30 NOTE — ED Notes (Signed)
I gave the patient a warm blanket. 

## 2012-04-30 NOTE — ED Notes (Signed)
I gave the patients visitor a cup of ice water. 

## 2012-04-30 NOTE — ED Notes (Signed)
Patient transported to Ultrasound 

## 2012-04-30 NOTE — ED Provider Notes (Signed)
History     CSN: [id number]  Arrival date & time 04/30/12  1519   First MD Initiated Contact with Patient 04/30/12 1809      Chief Complaint  Patient presents with  . Abdominal Pain    (Consider location/radiation/quality/duration/timing/severity/associated sxs/prior treatment) Patient is a 36 y.o. female presenting with abdominal pain. The history is provided by the patient. The history is limited by a language barrier. A language interpreter was used.  Abdominal Pain The primary symptoms of the illness include abdominal pain and nausea. The primary symptoms of the illness do not include fever, vomiting, diarrhea or dysuria. Episode onset: last night. The onset of the illness was sudden. Progression since onset: intermitent, worse with food   The illness is associated with eating. The patient states that she believes she is currently not pregnant. The patient has not had a change in bowel habit. Additional symptoms associated with the illness include heartburn. Symptoms associated with the illness do not include chills, anorexia, diaphoresis, constipation, urgency, hematuria, frequency or back pain. Significant associated medical issues include GERD and gallstones. Significant associated medical issues do not include inflammatory bowel disease, diabetes, sickle cell disease, liver disease, substance abuse, diverticulitis, HIV or cardiac disease.    Past Medical History  Diagnosis Date  . Seasonal allergies   . History of precipitous labor and deliveries, antepartum   . Intrahepatic cholestasis of pregnancy   . Anemia     Past Surgical History  Procedure Date  . Gynecologic cryosurgery     Family History  Problem Relation Age of Onset  . Peripheral vascular disease Mother   . Hypertension Mother   . Peripheral vascular disease Maternal Grandmother   . Anemia Maternal Grandmother   . Peripheral vascular disease Maternal Grandfather     History  Substance Use Topics  .  Smoking status: Never Smoker   . Smokeless tobacco: Never Used  . Alcohol Use: No    OB History    Grav Para Term Preterm Abortions TAB SAB Ect Mult Living   5 4 4  1  1   4       Review of Systems  Constitutional: Negative for fever, chills, diaphoresis and activity change.  HENT: Negative for congestion and neck pain.   Respiratory: Negative for cough.   Gastrointestinal: Positive for heartburn, nausea and abdominal pain. Negative for vomiting, diarrhea, constipation and anorexia.  Genitourinary: Negative for dysuria, urgency, frequency and hematuria.  Musculoskeletal: Negative for myalgias and back pain.  Skin: Negative for color change and wound.  Neurological: Negative for headaches.  All other systems reviewed and are negative.    Allergies  Review of patient's allergies indicates no known allergies.  Home Medications   Current Outpatient Rx  Name  Route  Sig  Dispense  Refill  . PRENATAL MULTIVITAMIN CH   Oral   Take 1 tablet by mouth daily.           BP 116/73  Pulse 70  Temp 98.4 F (36.9 C) (Oral)  Resp 16  SpO2 98%  Breastfeeding? Unknown  Physical Exam  Constitutional: She is oriented to person, place, and time. She appears well-developed and well-nourished. No distress.  HENT:  Head: Normocephalic and atraumatic.  Mouth/Throat: Oropharynx is clear and moist. No oropharyngeal exudate.  Eyes: Conjunctivae normal and EOM are normal. Pupils are equal, round, and reactive to light. No scleral icterus.  Neck: Normal range of motion. Neck supple. No tracheal deviation present. No thyromegaly present.  Cardiovascular: Normal rate,  regular rhythm, normal heart sounds and intact distal pulses.   Pulmonary/Chest: Effort normal and breath sounds normal. No stridor. No respiratory distress. She has no wheezes.  Abdominal: Soft. She exhibits no distension and no mass. There is tenderness in the epigastric area. There is no rebound and no guarding.     Musculoskeletal: Normal range of motion. She exhibits no edema and no tenderness.  Neurological: She is alert and oriented to person, place, and time. Coordination normal.  Skin: Skin is warm and dry. No rash noted. She is not diaphoretic. No erythema. No pallor.  Psychiatric: She has a normal mood and affect. Her behavior is normal.    ED Course  Procedures (including critical care time)  Labs Reviewed  BASIC METABOLIC PANEL - Abnormal; Notable for the following:    Glucose, Bld 107 (*)     GFR calc non Af Amer 89 (*)     All other components within normal limits  HEPATIC FUNCTION PANEL - Abnormal; Notable for the following:    Total Bilirubin 0.2 (*)     All other components within normal limits  URINALYSIS, ROUTINE W REFLEX MICROSCOPIC  CBC WITH DIFFERENTIAL  POCT PREGNANCY, URINE  LIPASE, BLOOD   US Abdomen Complete  04/30/2012  *RADIOLOGY REPORT*  Clinical Data:  Epigastric pain  ABDOMINAL ULTRASOUND COMPLETE  Comparison: 07/31/2009.  Findings:  Gallbladder:  Multiple stones measuring up to 1.4 cm identified within the lumen of the gallbladder. No wall thickening or pericholecystic fluid. Negative sonographic Murphy's sign.  Common Bile Duct:  Within normal limits in caliber.  Liver: No focal mass lesion identified.  Within normal limits in parenchymal echogenicity.  IVC:  Appears normal.  Pancreas:  No abnormality identified.  Spleen:  Within normal limits in size and echotexture.  Right kidney:  Normal in size and parenchymal echogenicity.  No evidence of mass or hydronephrosis.  Left kidney:  Normal in size and parenchymal echogenicity.  No evidence of mass or hydronephrosis.  Abdominal Aorta:  No aneurysm identified.  IMPRESSION: 1.  Gallstones. 2.  No secondary signs of acute cholecystitis.   Original Report Authenticated By: Signa Kell, M.D.      No diagnosis found.    MDM  36 year old female with a history of acid reflux presents emergency department complaining  of epigastric pain, onset last night after eating a spicy meal.  Labs and imaging reviewed without abnormalities other than cholelithiasis.  Patient's pain improved with GI cocktail.  Patient advised to begin a proton pump inhibitor and followup with PCP.  Resource guide given at discharge as well as general surgery followup if symptoms persist after one week.  Patient currently in no acute distress and agreeable with plan.  Strict return depressions discussed.         Jaci Carrel, [place] 04/30/12 2027

## 2012-04-30 NOTE — ED Provider Notes (Signed)
Medical screening examination/treatment/procedure(s) were performed by non-physician practitioner and as supervising physician I was immediately available for consultation/collaboration.   Richardean Canal, MD 04/30/12 2325

## 2012-04-30 NOTE — ED Notes (Signed)
Pt is here with mid upper abdominal pain for last 2 hours that radiates through to back and patient complains of sob.

## 2012-05-16 ENCOUNTER — Emergency Department (HOSPITAL_COMMUNITY): Payer: Medicaid Other

## 2012-05-16 ENCOUNTER — Inpatient Hospital Stay (HOSPITAL_COMMUNITY)
Admission: EM | Admit: 2012-05-16 | Discharge: 2012-05-18 | DRG: 418 | Disposition: A | Discharge status: Home / Self Care | Payer: Medicaid Other | Attending: General Surgery | Admitting: General Surgery

## 2012-05-16 ENCOUNTER — Encounter (HOSPITAL_COMMUNITY): Payer: Self-pay | Admitting: Emergency Medicine

## 2012-05-16 DIAGNOSIS — K8050 Calculus of bile duct without cholangitis or cholecystitis without obstruction: Secondary | ICD-10-CM

## 2012-05-16 DIAGNOSIS — K8020 Calculus of gallbladder without cholecystitis without obstruction: Secondary | ICD-10-CM | POA: Diagnosis present

## 2012-05-16 DIAGNOSIS — K8000 Calculus of gallbladder with acute cholecystitis without obstruction: Principal | ICD-10-CM | POA: Diagnosis present

## 2012-05-16 DIAGNOSIS — J45909 Unspecified asthma, uncomplicated: Secondary | ICD-10-CM | POA: Diagnosis present

## 2012-05-16 DIAGNOSIS — K810 Acute cholecystitis: Secondary | ICD-10-CM | POA: Diagnosis present

## 2012-05-16 DIAGNOSIS — K8066 Calculus of gallbladder and bile duct with acute and chronic cholecystitis without obstruction: Secondary | ICD-10-CM

## 2012-05-16 DIAGNOSIS — K219 Gastro-esophageal reflux disease without esophagitis: Secondary | ICD-10-CM | POA: Diagnosis present

## 2012-05-16 DIAGNOSIS — K821 Hydrops of gallbladder: Secondary | ICD-10-CM | POA: Diagnosis present

## 2012-05-16 LAB — CBC WITH DIFFERENTIAL/PLATELET
Basophils Absolute: 0 10*3/uL (ref 0.0–0.1)
Basophils Relative: 0 % (ref 0–1)
Eosinophils Absolute: 0.3 10*3/uL (ref 0.0–0.7)
Eosinophils Relative: 2 % (ref 0–5)
HCT: 40.8 % (ref 36.0–46.0)
Hemoglobin: 13.7 g/dL (ref 12.0–15.0)
Lymphocytes Relative: 20 % (ref 12–46)
Lymphs Abs: 2.4 10*3/uL (ref 0.7–4.0)
MCH: 27.6 pg (ref 26.0–34.0)
MCHC: 33.6 g/dL (ref 30.0–36.0)
MCV: 82.1 fL (ref 78.0–100.0)
Monocytes Absolute: 0.6 10*3/uL (ref 0.1–1.0)
Monocytes Relative: 5 % (ref 3–12)
Neutro Abs: 9 10*3/uL — ABNORMAL HIGH (ref 1.7–7.7)
Neutrophils Relative %: 73 % (ref 43–77)
Platelets: 258 10*3/uL (ref 150–400)
RBC: 4.97 MIL/uL (ref 3.87–5.11)
RDW: 12.3 % (ref 11.5–15.5)
WBC: 12.2 10*3/uL — ABNORMAL HIGH (ref 4.0–10.5)

## 2012-05-16 LAB — URINALYSIS, ROUTINE W REFLEX MICROSCOPIC
Bilirubin Urine: NEGATIVE
Glucose, UA: NEGATIVE mg/dL
Hgb urine dipstick: NEGATIVE
Ketones, ur: NEGATIVE mg/dL
Leukocytes, UA: NEGATIVE
Nitrite: NEGATIVE
Protein, ur: NEGATIVE mg/dL
Specific Gravity, Urine: 1.023 (ref 1.005–1.030)
Urobilinogen, UA: 0.2 mg/dL (ref 0.0–1.0)
pH: 7 (ref 5.0–8.0)

## 2012-05-16 LAB — COMPREHENSIVE METABOLIC PANEL
ALT: 23 U/L (ref 0–35)
AST: 18 U/L (ref 0–37)
Albumin: 4.4 g/dL (ref 3.5–5.2)
Alkaline Phosphatase: 92 U/L (ref 39–117)
BUN: 18 mg/dL (ref 6–23)
CO2: 25 mEq/L (ref 19–32)
Calcium: 9.8 mg/dL (ref 8.4–10.5)
Chloride: 101 mEq/L (ref 96–112)
Creatinine, Ser: 0.61 mg/dL (ref 0.50–1.10)
GFR calc Af Amer: >90 mL/min (ref >90)
GFR calc non Af Amer: >90 mL/min (ref >90)
Glucose, Bld: 103 mg/dL — ABNORMAL HIGH (ref 70–99)
Potassium: 3.6 mEq/L (ref 3.5–5.1)
Sodium: 137 mEq/L (ref 135–145)
Total Bilirubin: 0.3 mg/dL (ref 0.3–1.2)
Total Protein: 8.1 g/dL (ref 6.0–8.3)

## 2012-05-16 LAB — PREGNANCY, URINE: Preg Test, Ur: NEGATIVE

## 2012-05-16 LAB — LIPASE, BLOOD: Lipase: 32 U/L (ref 11–59)

## 2012-05-16 MED ORDER — HYDROMORPHONE HCL PF 1 MG/ML IJ SOLN
1.0000 mg | Freq: Once | INTRAMUSCULAR | Status: AC
  Administered 2012-05-16: 1 mg via INTRAVENOUS
  Filled 2012-05-16: qty 1

## 2012-05-16 MED ORDER — GI COCKTAIL ~~LOC~~
30.0000 mL | Freq: Once | ORAL | Status: AC
  Administered 2012-05-16: 30 mL via ORAL
  Filled 2012-05-16: qty 30

## 2012-05-16 MED ORDER — SODIUM CHLORIDE 0.9 % IV BOLUS (SEPSIS)
1000.0000 mL | Freq: Once | INTRAVENOUS | Status: AC
  Administered 2012-05-16: 1000 mL via INTRAVENOUS

## 2012-05-16 MED ORDER — KCL IN DEXTROSE-NACL 20-5-0.45 MEQ/L-%-% IV SOLN
INTRAVENOUS | Status: DC
  Administered 2012-05-16: 100 mL/h via INTRAVENOUS
  Administered 2012-05-17: 21:00:00 via INTRAVENOUS
  Filled 2012-05-16 (×5): qty 1000

## 2012-05-16 MED ORDER — ONDANSETRON HCL 4 MG/2ML IJ SOLN
4.0000 mg | Freq: Once | INTRAMUSCULAR | Status: AC
  Administered 2012-05-16: 4 mg via INTRAVENOUS
  Filled 2012-05-16: qty 2

## 2012-05-16 MED ORDER — SODIUM CHLORIDE 0.9 % IV SOLN
3.0000 g | Freq: Four times a day (QID) | INTRAVENOUS | Status: DC
  Administered 2012-05-16 – 2012-05-18 (×6): 3 g via INTRAVENOUS
  Filled 2012-05-16 (×9): qty 3

## 2012-05-16 MED ORDER — DIPHENHYDRAMINE HCL 50 MG/ML IJ SOLN: 12.5000 mg | Freq: Four times a day (QID) | INTRAMUSCULAR | Status: DC | PRN

## 2012-05-16 MED ORDER — MORPHINE SULFATE 4 MG/ML IJ SOLN
1.0000 mg | INTRAMUSCULAR | Status: DC | PRN
  Administered 2012-05-16 – 2012-05-17 (×5): 4 mg via INTRAVENOUS
  Administered 2012-05-17: 1 mg via INTRAVENOUS
  Administered 2012-05-17: 4 mg via INTRAVENOUS
  Filled 2012-05-16 (×8): qty 1

## 2012-05-16 MED ORDER — FENTANYL CITRATE 0.05 MG/ML IJ SOLN
100.0000 ug | Freq: Once | INTRAMUSCULAR | Status: AC
  Administered 2012-05-16: 100 ug via INTRAVENOUS
  Filled 2012-05-16: qty 2

## 2012-05-16 MED ORDER — PANTOPRAZOLE SODIUM 40 MG IV SOLR
40.0000 mg | Freq: Every day | INTRAVENOUS | Status: DC
  Administered 2012-05-17 (×2): 40 mg via INTRAVENOUS
  Filled 2012-05-16 (×3): qty 40

## 2012-05-16 MED ORDER — DIPHENHYDRAMINE HCL 12.5 MG/5ML PO ELIX
12.5000 mg | ORAL_SOLUTION | Freq: Four times a day (QID) | ORAL | Status: DC | PRN
  Filled 2012-05-16: qty 10

## 2012-05-16 MED ORDER — ONDANSETRON HCL 4 MG/2ML IJ SOLN
4.0000 mg | Freq: Four times a day (QID) | INTRAMUSCULAR | Status: DC | PRN
  Administered 2012-05-16 (×2): 4 mg via INTRAVENOUS
  Filled 2012-05-16 (×2): qty 2

## 2012-05-16 NOTE — ED Notes (Signed)
Pt c/o upper abd pain into back same as when seen here on 11/13; pt sts did not follow up with GI and had pain and nausea starting this am

## 2012-05-16 NOTE — ED Provider Notes (Signed)
History     CSN: [id number]  Arrival date & time 05/16/12  7829   First MD Initiated Contact with Patient 05/16/12 1010      Chief Complaint  Patient presents with  . Abdominal Pain    (Consider location/radiation/quality/duration/timing/severity/associated sxs/prior treatment) HPI The patient presents to the ED with a past medical history of GERD and Gallstones with Epigastric discomfort since 3AM.  She describes the discomfort as a sharp pain that increased in severity to a 10/10 at 8 AM.  She reports nausea and one emesis occurrence of. Reports multiple watery stools this morning. Denies fever or chills. She reports pain is similar to previous episode but with an increase in severity.The patient states she took PEPCID without relief.  Mrs. Rothenberger reports eating some spicy [place] last night for dinner.  LNMP:1 year ago.  Past Medical History  Diagnosis Date  . Seasonal allergies   . History of precipitous labor and deliveries, antepartum   . Intrahepatic cholestasis of pregnancy   . Anemia     Past Surgical History  Procedure Date  . Gynecologic cryosurgery     Family History  Problem Relation Age of Onset  . Peripheral vascular disease Mother   . Hypertension Mother   . Peripheral vascular disease Maternal Grandmother   . Anemia Maternal Grandmother   . Peripheral vascular disease Maternal Grandfather     History  Substance Use Topics  . Smoking status: Never Smoker   . Smokeless tobacco: Never Used  . Alcohol Use: No    OB History    Grav Para Term Preterm Abortions TAB SAB Ect Mult Living   5 4 4  1  1   4       Review of Systems  Allergies  Review of patient's allergies indicates no known allergies.  Home Medications   Current Outpatient Rx  Name  Route  Sig  Dispense  Refill  . FAMOTIDINE 20 MG PO TABS   Oral   Take 1 tablet (20 mg total) by mouth 2 (two) times daily.   30 tablet   0   . OMEPRAZOLE 20 MG PO CPDR   Oral   Take 1 capsule  (20 mg total) by mouth daily.   30 capsule   3     BP 149/119  Pulse 65  Temp 99 F (37.2 C) (Oral)  Resp 24  SpO2 97%  Breastfeeding? Unknown  Physical Exam  Constitutional: She appears well-developed and well-nourished.  Abdominal: Soft. Normal appearance. There is no rigidity, no rebound, no guarding, no tenderness at McBurney's point and negative Murphy's sign.    Neurological: She is alert.    ED Course  Procedures (including critical care time)  Labs Reviewed  COMPREHENSIVE METABOLIC PANEL - Abnormal; Notable for the following:    Glucose, Bld 103 (*)     All other components within normal limits  CBC WITH DIFFERENTIAL - Abnormal; Notable for the following:    WBC 12.2 (*)     Neutro Abs 9.0 (*)     All other components within normal limits  LIPASE, BLOOD  URINALYSIS, ROUTINE W REFLEX MICROSCOPIC  PREGNANCY, URINE    1115 Patient reports vomiting within a minute after receiving a GI cocktail.  Reports minor improvement in symptoms is able to ambulate to the restroom.   0235PM Patient is having RUQ pain on exam. The patient will be given further pain control.  I spoke with Dr. Luisa Hart through the OR nurse and the Surgery PA  will be down to see the patient.  Will send to the CDU  MDM          Carlyle Dolly, PA-C 05/16/12 1541

## 2012-05-16 NOTE — H&P (Signed)
Kimberly Copeland is an 36 y.o. female.   Chief Complaint: RUQ and epigastric pain since 3am this morning HPI: 36 y/o female presents to the [facility] with a past medical history of GERD and Gallstones with Epigastric/RUQ discomfort since 3AM. She describes the discomfort as a sharp pain that increased in severity to a 10/10 at 8 AM. She reports nausea and one emesis occurrence of. Reports multiple watery stools this morning. Denies fever or chills. She reports pain is similar to previous episode a couple of weeks ago, but with an increase in severity this time. The patient states she took PEPCID without relief. Kimberly Copeland reports eating some spicy [place] last night for dinner.   Pt was found to again have gallstones on ultrasound, and leukocytosis (12.2).  All other labs are normal.  Pt is 5 months post partum on her last child.  Her son is at bedside and is able to translate for his mother.     Past Medical History  Diagnosis Date  . Seasonal allergies   . History of precipitous labor and deliveries, antepartum   . Intrahepatic cholestasis of pregnancy   . Anemia     Past Surgical History  Procedure Date  . Gynecologic cryosurgery     Family History  Problem Relation Age of Onset  . Peripheral vascular disease Mother   . Hypertension Mother   . Peripheral vascular disease Maternal Grandmother   . Anemia Maternal Grandmother   . Peripheral vascular disease Maternal Grandfather    Social History:  reports that she has never smoked. She has never used smokeless tobacco. She reports that she does not drink alcohol or use illicit drugs.  Allergies: No Known Allergies   (Not in a hospital admission)  Results for orders placed during the hospital encounter of 05/16/12 (from the past 48 hour(s))  COMPREHENSIVE METABOLIC PANEL     Status: Abnormal   Collection Time   05/16/12  9:34 AM      Component Value Range Comment   Sodium 137  135 - 145 mEq/L    Potassium 3.6  3.5 - 5.1 mEq/L     Chloride 101  96 - 112 mEq/L    CO2 25  19 - 32 mEq/L    Glucose, Bld 103 (*) 70 - 99 mg/dL    BUN 18  6 - 23 mg/dL    Creatinine, Ser 4.09  0.50 - 1.10 mg/dL    Calcium 9.8  8.4 - 81.1 mg/dL    Total Protein 8.1  6.0 - 8.3 g/dL    Albumin 4.4  3.5 - 5.2 g/dL    AST 18  0 - 37 U/L    ALT 23  0 - 35 U/L    Alkaline Phosphatase 92  39 - 117 U/L    Total Bilirubin 0.3  0.3 - 1.2 mg/dL    GFR calc non Af Amer >90  >90 mL/min    GFR calc Af Amer >90  >90 mL/min   CBC WITH DIFFERENTIAL     Status: Abnormal   Collection Time   05/16/12  9:34 AM      Component Value Range Comment   WBC 12.2 (*) 4.0 - 10.5 K/uL    RBC 4.97  3.87 - 5.11 MIL/uL    Hemoglobin 13.7  12.0 - 15.0 g/dL    HCT 91.4  78.2 - 95.6 %    MCV 82.1  78.0 - 100.0 fL    MCH 27.6  26.0 - 34.0 pg  MCHC 33.6  30.0 - 36.0 g/dL    RDW 54.0  98.1 - 19.1 %    Platelets 258  150 - 400 K/uL    Neutrophils Relative 73  43 - 77 %    Neutro Abs 9.0 (*) 1.7 - 7.7 K/uL    Lymphocytes Relative 20  12 - 46 %    Lymphs Abs 2.4  0.7 - 4.0 K/uL    Monocytes Relative 5  3 - 12 %    Monocytes Absolute 0.6  0.1 - 1.0 K/uL    Eosinophils Relative 2  0 - 5 %    Eosinophils Absolute 0.3  0.0 - 0.7 K/uL    Basophils Relative 0  0 - 1 %    Basophils Absolute 0.0  0.0 - 0.1 K/uL   LIPASE, BLOOD     Status: Normal   Collection Time   05/16/12  9:34 AM      Component Value Range Comment   Lipase 32  11 - 59 U/L   URINALYSIS, ROUTINE W REFLEX MICROSCOPIC     Status: Normal   Collection Time   05/16/12 11:40 AM      Component Value Range Comment   Color, Urine YELLOW  YELLOW    APPearance CLEAR  CLEAR    Specific Gravity, Urine 1.023  1.005 - 1.030    pH 7.0  5.0 - 8.0    Glucose, UA NEGATIVE  NEGATIVE mg/dL    Hgb urine dipstick NEGATIVE  NEGATIVE    Bilirubin Urine NEGATIVE  NEGATIVE    Ketones, ur NEGATIVE  NEGATIVE mg/dL    Protein, ur NEGATIVE  NEGATIVE mg/dL    Urobilinogen, UA 0.2  0.0 - 1.0 mg/dL    Nitrite NEGATIVE   NEGATIVE    Leukocytes, UA NEGATIVE  NEGATIVE MICROSCOPIC NOT DONE ON URINES WITH NEGATIVE PROTEIN, BLOOD, LEUKOCYTES, NITRITE, OR GLUCOSE <1000 mg/dL.  PREGNANCY, URINE     Status: Normal   Collection Time   05/16/12 11:40 AM      Component Value Range Comment   Preg Test, Ur NEGATIVE  NEGATIVE    US Abdomen Complete  05/16/2012  *RADIOLOGY REPORT*  Clinical Data:  right upper quadrant pain.  COMPLETE ABDOMINAL ULTRASOUND  Comparison:  04/30/2012  Findings:  Gallbladder:  Multiple mobile gallstones are noted in the gallbladder, unchanged since recent study.  No wall thickening. The patient was tender over the gallbladder during the study.  Common bile duct:   Normal caliber, 5 mm.  Liver:  No focal lesion identified.  Within normal limits in parenchymal echogenicity.  IVC:  Appears normal.  Pancreas:  No focal abnormality seen.  Spleen:  Within normal limits in size and echotexture.  Right Kidney:   Normal in size and parenchymal echogenicity.  No evidence of mass or hydronephrosis.  Left Kidney:  Normal in size and parenchymal echogenicity.  No evidence of mass or hydronephrosis.  Abdominal aorta:  No aneurysm identified.  IMPRESSION: Cholelithiasis.  No wall thickening or pericholecystic fluid, but the patient was tender over the gallbladder during the examination.   Original Report Authenticated By: Charlett Nose, M.D.     Review of Systems  Constitutional: Positive for malaise/fatigue. Negative for fever and chills.  Respiratory: Negative for cough and shortness of breath.   Cardiovascular: Negative for chest pain and leg swelling.  Gastrointestinal: Positive for heartburn, nausea, vomiting and abdominal pain (ruq and epigastric pain). Negative for constipation and blood in stool.  Genitourinary: Negative for dysuria.  Neurological: Negative for headaches.    Blood pressure 136/76, pulse 58, temperature 99 F (37.2 C), temperature source Oral, resp. rate 20, SpO2 98.00%, unknown if  currently breastfeeding. Physical Exam  Constitutional: She is oriented to person, place, and time. She appears well-developed and well-nourished.  HENT:  Head: Normocephalic and atraumatic.  Eyes: Conjunctivae normal and EOM are normal. Pupils are equal, round, and reactive to light.  Neck: Normal range of motion.  Cardiovascular: Normal rate and regular rhythm.  Exam reveals no gallop and no friction rub.   No murmur heard. Respiratory: Effort normal and breath sounds normal. No respiratory distress. She has no wheezes. She has no rales. She exhibits no tenderness.  GI: Soft. Bowel sounds are normal. She exhibits no distension and no mass. There is tenderness (RUQ AND EPIGASTRIC). There is no rebound and no guarding.  Neurological: She is alert and oriented to person, place, and time.  Skin: Skin is warm and dry. No erythema.  Psychiatric: She has a normal mood and affect. Her behavior is normal. Thought content normal.     Assessment/Plan Biliary colic with possible early cholecystitis (given WBC count) and cholelithiasis 1.  Admit to med/surg, IVF, pain control, antibiotics, NPO for anticipated surgery as early as tomorrow 2.  Pt will need hospital translator to obtain consent for surgery 3.  Likely laparoscopic cholecystectomy with IOC, Dr. Luisa Hart to eval and decide on timeframe 4.  Pt is breastfeeding, told to hold off until after surgery and not taking medication which can be transmitted through breast milk.   Aris Georgia 05/16/2012, 3:46 PM

## 2012-05-16 NOTE — H&P (Signed)
PT SEEN AND EXAMINED.  ADMIT  FOR POSSIBLE CHOLECYSTECTOMY IN AM

## 2012-05-17 ENCOUNTER — Encounter (HOSPITAL_COMMUNITY): Admission: EM | Disposition: A | Discharge status: Home / Self Care | Payer: Self-pay | Source: Home / Self Care

## 2012-05-17 ENCOUNTER — Encounter (HOSPITAL_COMMUNITY): Payer: Self-pay | Admitting: Certified Registered Nurse Anesthetist

## 2012-05-17 ENCOUNTER — Inpatient Hospital Stay (HOSPITAL_COMMUNITY): Payer: Medicaid Other

## 2012-05-17 ENCOUNTER — Inpatient Hospital Stay (HOSPITAL_COMMUNITY): Payer: Medicaid Other | Admitting: Certified Registered Nurse Anesthetist

## 2012-05-17 HISTORY — PX: CHOLECYSTECTOMY: SHX55

## 2012-05-17 LAB — CBC
HCT: 39.6 % (ref 36.0–46.0)
Hemoglobin: 13.2 g/dL (ref 12.0–15.0)
MCHC: 33.3 g/dL (ref 30.0–36.0)
MCV: 82.7 fL (ref 78.0–100.0)
RDW: 12.5 % (ref 11.5–15.5)

## 2012-05-17 SURGERY — LAPAROSCOPIC CHOLECYSTECTOMY WITH INTRAOPERATIVE CHOLANGIOGRAM
Anesthesia: General | Site: Abdomen | Wound class: Contaminated

## 2012-05-17 MED ORDER — BUPIVACAINE-EPINEPHRINE 0.25% -1:200000 IJ SOLN
INTRAMUSCULAR | Status: DC | PRN
  Administered 2012-05-17: 20 mL

## 2012-05-17 MED ORDER — ROCURONIUM BROMIDE 100 MG/10ML IV SOLN
INTRAVENOUS | Status: DC | PRN
  Administered 2012-05-17: 30 mg via INTRAVENOUS
  Administered 2012-05-17: 10 mg via INTRAVENOUS

## 2012-05-17 MED ORDER — FENTANYL CITRATE 0.05 MG/ML IJ SOLN
INTRAMUSCULAR | Status: DC | PRN
  Administered 2012-05-17 (×3): 50 ug via INTRAVENOUS
  Administered 2012-05-17: 100 ug via INTRAVENOUS

## 2012-05-17 MED ORDER — MORPHINE SULFATE 2 MG/ML IJ SOLN: 2.0000 mg | INTRAMUSCULAR | Status: DC | PRN

## 2012-05-17 MED ORDER — PHENYLEPHRINE HCL 10 MG/ML IJ SOLN
INTRAMUSCULAR | Status: DC | PRN
  Administered 2012-05-17 (×2): 40 ug via INTRAVENOUS

## 2012-05-17 MED ORDER — MIDAZOLAM HCL 5 MG/5ML IJ SOLN
INTRAMUSCULAR | Status: DC | PRN
  Administered 2012-05-17: 1 mg via INTRAVENOUS

## 2012-05-17 MED ORDER — OXYCODONE HCL 5 MG/5ML PO SOLN: 5.0000 mg | Freq: Once | ORAL | Status: DC | PRN

## 2012-05-17 MED ORDER — SODIUM CHLORIDE 0.9 % IV SOLN
INTRAVENOUS | Status: DC | PRN
  Administered 2012-05-17: 12:00:00

## 2012-05-17 MED ORDER — NEOSTIGMINE METHYLSULFATE 1 MG/ML IJ SOLN
INTRAMUSCULAR | Status: DC | PRN
  Administered 2012-05-17: 5 mg via INTRAVENOUS

## 2012-05-17 MED ORDER — LIDOCAINE HCL (CARDIAC) 20 MG/ML IV SOLN
INTRAVENOUS | Status: DC | PRN
  Administered 2012-05-17: 80 mg via INTRAVENOUS

## 2012-05-17 MED ORDER — OXYCODONE HCL 5 MG PO TABS
ORAL_TABLET | ORAL | Status: AC
  Filled 2012-05-17: qty 2

## 2012-05-17 MED ORDER — OXYCODONE HCL 5 MG PO TABS
5.0000 mg | ORAL_TABLET | ORAL | Status: DC | PRN
  Administered 2012-05-17 – 2012-05-18 (×4): 10 mg via ORAL
  Filled 2012-05-17 (×3): qty 2

## 2012-05-17 MED ORDER — GLYCOPYRROLATE 0.2 MG/ML IJ SOLN
INTRAMUSCULAR | Status: DC | PRN
  Administered 2012-05-17: .8 mg via INTRAVENOUS

## 2012-05-17 MED ORDER — LACTATED RINGERS IV SOLN
INTRAVENOUS | Status: DC | PRN
  Administered 2012-05-17 (×2): via INTRAVENOUS

## 2012-05-17 MED ORDER — ONDANSETRON HCL 4 MG/2ML IJ SOLN: 4.0000 mg | Freq: Four times a day (QID) | INTRAMUSCULAR | Status: DC | PRN

## 2012-05-17 MED ORDER — HYDROMORPHONE HCL PF 1 MG/ML IJ SOLN
0.2500 mg | INTRAMUSCULAR | Status: DC | PRN
  Administered 2012-05-17 (×2): 0.5 mg via INTRAVENOUS

## 2012-05-17 MED ORDER — SODIUM CHLORIDE 0.9 % IR SOLN
Status: DC | PRN
  Administered 2012-05-17: 1000 mL

## 2012-05-17 MED ORDER — OXYCODONE HCL 5 MG PO TABS: 5.0000 mg | ORAL_TABLET | Freq: Once | ORAL | Status: DC | PRN

## 2012-05-17 MED ORDER — BUPIVACAINE-EPINEPHRINE PF 0.25-1:200000 % IJ SOLN
INTRAMUSCULAR | Status: AC
  Filled 2012-05-17: qty 30

## 2012-05-17 MED ORDER — ONDANSETRON HCL 4 MG/2ML IJ SOLN
INTRAMUSCULAR | Status: DC | PRN
  Administered 2012-05-17: 4 mg via INTRAVENOUS

## 2012-05-17 MED ORDER — PROPOFOL 10 MG/ML IV BOLUS
INTRAVENOUS | Status: DC | PRN
  Administered 2012-05-17: 200 mg via INTRAVENOUS

## 2012-05-17 MED ORDER — SUCCINYLCHOLINE CHLORIDE 20 MG/ML IJ SOLN
INTRAMUSCULAR | Status: DC | PRN
  Administered 2012-05-17: 120 mg via INTRAVENOUS

## 2012-05-17 SURGICAL SUPPLY — 41 items
APPLIER CLIP 5 13 M/L LIGAMAX5 (MISCELLANEOUS) ×2
APPLIER CLIP ROT 10 11.4 M/L (STAPLE)
BLADE SURG ROTATE 9660 (MISCELLANEOUS) IMPLANT
CANISTER SUCTION 2500CC (MISCELLANEOUS) ×2 IMPLANT
CHLORAPREP W/TINT 26ML (MISCELLANEOUS) ×2 IMPLANT
CLIP APPLIE 5 13 M/L LIGAMAX5 (MISCELLANEOUS) ×1 IMPLANT
CLIP APPLIE ROT 10 11.4 M/L (STAPLE) IMPLANT
CLOTH BEACON ORANGE TIMEOUT ST (SAFETY) ×2 IMPLANT
COVER MAYO STAND STRL (DRAPES) ×2 IMPLANT
COVER SURGICAL LIGHT HANDLE (MISCELLANEOUS) ×2 IMPLANT
DECANTER SPIKE VIAL GLASS SM (MISCELLANEOUS) IMPLANT
DERMABOND ADHESIVE PROPEN (GAUZE/BANDAGES/DRESSINGS) ×1
DERMABOND ADVANCED (GAUZE/BANDAGES/DRESSINGS) ×1
DERMABOND ADVANCED .7 DNX12 (GAUZE/BANDAGES/DRESSINGS) ×1 IMPLANT
DERMABOND ADVANCED .7 DNX6 (GAUZE/BANDAGES/DRESSINGS) ×1 IMPLANT
DRAPE C-ARM 42X72 X-RAY (DRAPES) ×2 IMPLANT
DRAPE UTILITY 15X26 W/TAPE STR (DRAPE) ×4 IMPLANT
ELECT REM PT RETURN 9FT ADLT (ELECTROSURGICAL) ×2
ELECTRODE REM PT RTRN 9FT ADLT (ELECTROSURGICAL) ×1 IMPLANT
FILTER SMOKE EVAC LAPAROSHD (FILTER) IMPLANT
GLOVE BIO SURGEON STRL SZ8 (GLOVE) ×2 IMPLANT
GLOVE BIOGEL PI IND STRL 8 (GLOVE) ×1 IMPLANT
GLOVE BIOGEL PI INDICATOR 8 (GLOVE) ×1
GOWN PREVENTION PLUS XLARGE (GOWN DISPOSABLE) ×2 IMPLANT
GOWN STRL NON-REIN LRG LVL3 (GOWN DISPOSABLE) ×6 IMPLANT
KIT BASIN OR (CUSTOM PROCEDURE TRAY) ×2 IMPLANT
KIT ROOM TURNOVER OR (KITS) ×2 IMPLANT
NS IRRIG 1000ML POUR BTL (IV SOLUTION) ×2 IMPLANT
PAD ARMBOARD 7.5X6 YLW CONV (MISCELLANEOUS) ×2 IMPLANT
POUCH SPECIMEN RETRIEVAL 10MM (ENDOMECHANICALS) ×2 IMPLANT
SCISSORS LAP 5X35 DISP (ENDOMECHANICALS) ×2 IMPLANT
SET CHOLANGIOGRAPH 5 50 .035 (SET/KITS/TRAYS/PACK) ×2 IMPLANT
SET IRRIG TUBING LAPAROSCOPIC (IRRIGATION / IRRIGATOR) ×2 IMPLANT
SPECIMEN JAR SMALL (MISCELLANEOUS) ×2 IMPLANT
SUT VIC AB 4-0 PS2 27 (SUTURE) ×2 IMPLANT
TOWEL OR 17X24 6PK STRL BLUE (TOWEL DISPOSABLE) ×2 IMPLANT
TOWEL OR 17X26 10 PK STRL BLUE (TOWEL DISPOSABLE) ×2 IMPLANT
TRAY LAPAROSCOPIC (CUSTOM PROCEDURE TRAY) ×2 IMPLANT
TROCAR HASSON GELL 12X100 (TROCAR) ×2 IMPLANT
TROCAR Z-THREAD FIOS 5X100MM (TROCAR) ×6 IMPLANT
WATER STERILE IRR 1000ML POUR (IV SOLUTION) IMPLANT

## 2012-05-17 NOTE — Transfer of Care (Signed)
Immediate Anesthesia Transfer of Care Note  Patient: Kimberly Copeland [facility]  Procedure(s) Performed: Procedure(s) (LRB) with comments: LAPAROSCOPIC CHOLECYSTECTOMY WITH INTRAOPERATIVE CHOLANGIOGRAM (N/A)  Patient Location: PACU  Anesthesia Type:General  Level of Consciousness: awake, alert  and oriented  Airway & Oxygen Therapy: Patient Spontanous Breathing  Post-op Assessment: Report given to PACU RN  Post vital signs: stable  Complications: No apparent anesthesia complications

## 2012-05-17 NOTE — Op Note (Signed)
05/16/2012 - 05/17/2012  12:30 PM  PATIENT:  Kimberly Copeland  36 y.o. female  PRE-OPERATIVE DIAGNOSIS:  Acute Cholecystitis  POST-OPERATIVE DIAGNOSIS:  Acute Cholecystitis with hydrops  PROCEDURE:  Procedure(s): LAPAROSCOPIC CHOLECYSTECTOMY WITH INTRAOPERATIVE CHOLANGIOGRAM  SURGEON:  Surgeon(s): Liz Malady, MD  PHYSICIAN ASSISTANT:   ASSISTANTS: none   ANESTHESIA:   local and general  EBL:     BLOOD ADMINISTERED:none  DRAINS: none   SPECIMEN:  Excision  DISPOSITION OF SPECIMEN:  PATHOLOGY  COUNTS:  YES  DICTATION: Reubin Milan Dictation  Patient presents for cholecystectomy. Informed consent was obtained. She is on IV antibiotics. She was identified in the preop holding area. She is brought to the operating room and general endotracheal anesthesia was a Optician, dispensing by the anesthesia staff. Her abdomen was prepped and draped in sterile fashion. We did time out procedure. Infraumbilical incision was made after injecting local. Infraumbilical area was dissected down revealing the anterior fascia. This was divided along the midline. Peritoneal cavity was entered under direct vision. 0 Vicryl pursestring sutures placed on the fascial opening. Hassan trocar was inserted into the abdomen. Abdomen was insufflated with carbon dioxide in standard fashion. Under direct vision a 5 mm epigastric and 5 mm lateral ports x2 were inserted. Local was used at these port sites. Laparoscopic exploration revealed a very tense edematous gallbladder. I attempted to grasp the4 releasing clear bile consistent with hydrops.  This was thoroughly suctioned out and the suction port was inserted via the small tear to suction of the gallbladder. The gallbladder was regrasped and the dome was retracted superior medially.  The infundibulum was retracted inferior laterally. Dissection began laterally and progressed medially easily identifying the cystic duct. Dissection continued until we had a critical view with  large window between the cystic duct the infundibulum and the liver. Once this was accomplished a clip was placed on the infundibular cystic duct junction. Small nick was made the cystic duct and cholangiocatheter was inserted. Intraoperative Cholangiogram revealed good flow of contrast into the duodenum, normal anatomy, no common bile duct filling defects.  Catheter was removed. 3 clips were placed proximally on the cystic duct and it was divided. Further dissection revealed an anterior and posterior branch of the cystic artery. These were clipped Oxley and divided distally with cautery. Gallbladder was taken off the liver bed with Bovie cautery. A third branch of the cystic duct was located. This was clipped twice proximally and divided distally with cautery. Gallbladder was taken off the liver bed and placed in the Endo Catch bag. Was removed from the abdomen via the infraumbilical port site. Liver bed was cauterized to get excellent hemostasis. It was thoroughly irrigated. Irrigation fluid returned clear. Liver bed was dry. Clips were in good position. Ports were removed under direct vision. Pneumoperitoneum was released. Informed local fascia was closed by tying the 0 Vicryl pursestring suture with care not to trap any intra-abdominal contents. All 4 wounds were copiously irrigated and the skin of each was closed with running 4-0 Monocryl followed by Dermabond. All counts were correct. Patient tolerated procedure well without apparent complication and was taken recovery in stable condition.  PATIENT DISPOSITION:  PACU - hemodynamically stable.   Delay start of Pharmacological VTE agent (>24hrs) due to surgical blood loss or risk of bleeding:  no  Violeta Gelinas, MD, MPH, FACS Pager: [phone]  11/30/201312:30 PM

## 2012-05-17 NOTE — Anesthesia Procedure Notes (Signed)
Procedure Name: Intubation Date/Time: 05/17/2012 11:22 AM Performed by: Ellin Goodie Pre-anesthesia Checklist: Patient identified, Emergency Drugs available, Suction available, Patient being monitored and Timeout performed Patient Re-evaluated:Patient Re-evaluated prior to inductionOxygen Delivery Method: Circle system utilized Preoxygenation: Pre-oxygenation with 100% oxygen Intubation Type: IV induction and Rapid sequence Ventilation: Mask ventilation without difficulty Laryngoscope Size: Mac and 4 Grade View: Grade I Tube type: Oral Number of attempts: 1 Airway Equipment and Method: Stylet Placement Confirmation: ETT inserted through vocal cords under direct vision,  positive ETCO2 and breath sounds checked- equal and bilateral Secured at: 22 cm Tube secured with: Tape Dental Injury: Teeth and Oropharynx as per pre-operative assessment

## 2012-05-17 NOTE — Progress Notes (Signed)
Nutrition Brief Note  Patient identified on the Malnutrition Screening Tool (MST) Report with a score of 2.   Body mass index is 29.95 kg/(m^2). Pt meets criteria for overweight based on current BMI.   Current diet order is Clear liquids, patient is consuming approximately 75% of meals at this time. Labs and medications reviewed.   Patient reports her appetite is good, with no recent weight loss.   No nutrition interventions warranted at this time. If nutrition issues arise, please consult RD.   Linnell Fulling, RD, LDN Pager #: [phone] After-Hours Pager #: [phone]

## 2012-05-17 NOTE — Anesthesia Postprocedure Evaluation (Signed)
Anesthesia Post Note  Patient: Kimberly Copeland  Procedure(s) Performed: Procedure(s) (LRB): LAPAROSCOPIC CHOLECYSTECTOMY WITH INTRAOPERATIVE CHOLANGIOGRAM (N/A)  Anesthesia type: General  Patient location: PACU  Post pain: Pain level controlled and Adequate analgesia  Post assessment: Post-op Vital signs reviewed, Patient's Cardiovascular Status Stable, Respiratory Function Stable, Patent Airway and Pain level controlled  Last Vitals:  Filed Vitals:   05/17/12 1315  BP: 111/64  Pulse: 69  Temp:   Resp: 18    Post vital signs: Reviewed and stable  Level of consciousness: awake, alert  and oriented  Complications: No apparent anesthesia complications

## 2012-05-17 NOTE — Progress Notes (Signed)
  Subjective: RUQ pain improved  Objective: Vital signs in last 24 hours: Temp:  [98.3 F (36.8 C)-99.1 F (37.3 C)] 99.1 F (37.3 C) (11/30 9629) Pulse Rate:  [57-65] 64  (11/30 0638) Resp:  [15-24] 18  (11/30 5284) BP: (118-149)/(70-119) 123/70 mmHg (11/30 0638) SpO2:  [97 %-99 %] 99 % (11/30 1324) Weight:  [81.647 kg (180 lb)] 81.647 kg (180 lb) (11/29 2300)    Intake/Output from previous day:   Intake/Output this shift:    General appearance: alert and cooperative Chest wall: Lungs CTA Cardio: regular rate and rhythm GI: soft, mild RUQ tenderness  Lab Results:   [facility] 05/17/12 0627 05/16/12 0934  WBC 13.5* 12.2*  HGB 13.2 13.7  HCT 39.6 40.8  PLT 233 258   BMET  Basename 05/16/12 0934  NA 137  K 3.6  CL 101  CO2 25  GLUCOSE 103*  BUN 18  CREATININE 0.61  CALCIUM 9.8   PT/INR No results found for this basename: LABPROT:2,INR:2 in the last 72 hours ABG No results found for this basename: PHART:2,PCO2:2,PO2:2,HCO3:2 in the last 72 hours  Studies/Results: US Abdomen Complete  05/16/2012  *RADIOLOGY REPORT*  Clinical Data:  right upper quadrant pain.  COMPLETE ABDOMINAL ULTRASOUND  Comparison:  04/30/2012  Findings:  Gallbladder:  Multiple mobile gallstones are noted in the gallbladder, unchanged since recent study.  No wall thickening. The patient was tender over the gallbladder during the study.  Common bile duct:   Normal caliber, 5 mm.  Liver:  No focal lesion identified.  Within normal limits in parenchymal echogenicity.  IVC:  Appears normal.  Pancreas:  No focal abnormality seen.  Spleen:  Within normal limits in size and echotexture.  Right Kidney:   Normal in size and parenchymal echogenicity.  No evidence of mass or hydronephrosis.  Left Kidney:  Normal in size and parenchymal echogenicity.  No evidence of mass or hydronephrosis.  Abdominal aorta:  No aneurysm identified.  IMPRESSION: Cholelithiasis.  No wall thickening or pericholecystic fluid, but  the patient was tender over the gallbladder during the examination.   Original Report Authenticated By: Charlett Nose, M.D.     Anti-infectives: Anti-infectives     Start     Dose/Rate Route Frequency Ordered Stop   05/16/12 2045   Ampicillin-Sulbactam (UNASYN) 3 g in sodium chloride 0.9 % 100 mL IVPB        3 g 100 mL/hr over 60 Minutes Intravenous Every 6 hours 05/16/12 2033            Assessment/Plan: s/p Procedure(s) (LRB) with comments: LAPAROSCOPIC CHOLECYSTECTOMY WITH INTRAOPERATIVE CHOLANGIOGRAM (N/A) to OR for Lap Chole/IOC. Procedure, risks, benefits D/W patient by myself in Spanish. I answered her questions.  She agrees.  LOS: 1 day    Kimberly Copeland 05/17/2012

## 2012-05-17 NOTE — Preoperative (Signed)
Beta Blockers   Reason not to administer Beta Blockers:Not Applicable, No BB therapy 

## 2012-05-17 NOTE — Anesthesia Preprocedure Evaluation (Addendum)
Anesthesia Evaluation  Patient identified by MRN, date of birth, ID band Patient awake    Reviewed: Allergy & Precautions, H&P , NPO status , Patient's Chart, lab work & pertinent test results  Airway Mallampati: II  Neck ROM: full    Dental   Pulmonary asthma ,          Cardiovascular negative cardio ROS      Neuro/Psych negative neurological ROS  negative psych ROS   GI/Hepatic negative GI ROS, Neg liver ROS,   Endo/Other  negative endocrine ROS  Renal/GU negative Renal ROS     Musculoskeletal negative musculoskeletal ROS (+)   Abdominal   Peds negative pediatric ROS (+)  Hematology negative hematology ROS (+)   Anesthesia Other Findings   Reproductive/Obstetrics negative OB ROS                          Anesthesia Physical Anesthesia Plan  ASA: I  Anesthesia Plan: General   Post-op Pain Management:    Induction: Intravenous  Airway Management Planned: Oral ETT  Additional Equipment:   Intra-op Plan:   Post-operative Plan: Extubation in OR  Informed Consent: I have reviewed the patients History and Physical, chart, labs and discussed the procedure including the risks, benefits and alternatives for the proposed anesthesia with the patient or authorized representative who has indicated his/her understanding and acceptance.     Plan Discussed with: CRNA and Surgeon  Anesthesia Plan Comments:         Anesthesia Quick Evaluation

## 2012-05-17 NOTE — ED Provider Notes (Signed)
Medical screening examination/treatment/procedure(s) were performed by non-physician practitioner and as supervising physician I was immediately available for consultation/collaboration.  Geoffery Lyons, MD 05/17/12 [phone]

## 2012-05-18 MED ORDER — OXYCODONE-ACETAMINOPHEN 5-325 MG PO TABS: 0.5000 | ORAL_TABLET | ORAL | Status: DC | PRN

## 2012-05-18 NOTE — Discharge Summary (Signed)
Physician Discharge Summary  Patient ID: Kimberly Copeland MRN: [id number] DOB/AGE: [birth year] 36 y.o.  Admit date: 05/16/2012 Discharge date: 05/18/2012  Admitting Diagnosis: Cholelithiasis, nausea, vomiting  Discharge Diagnosis Patient Active Problem List   Diagnosis Date Noted  . Gallstones 05/16/2012  . Acute cholecystitis 05/16/2012    Consultants None  Imaging: Dg Cholangiogram Operative  05/17/2012  *RADIOLOGY REPORT*  Clinical Data:   Cholelithiasis.  INTRAOPERATIVE CHOLANGIOGRAM  Technique:  Cholangiographic images from the C-arm fluoroscopic device were submitted for interpretation post-operatively.  Please see the procedural report for the amount of contrast and the fluoroscopy time utilized.  Comparison:  Ultrasound dated 05/16/2012  Findings:  The visualized portion of the biliary tree is normal with free flow of contrast into the duodenum.  IMPRESSION: Normal intraoperative cholangiogram.   Original Report Authenticated By: Francene Boyers, M.D.    US Abdomen Complete  05/16/2012  *RADIOLOGY REPORT*  Clinical Data:  right upper quadrant pain.  COMPLETE ABDOMINAL ULTRASOUND  Comparison:  04/30/2012  Findings:  Gallbladder:  Multiple mobile gallstones are noted in the gallbladder, unchanged since recent study.  No wall thickening. The patient was tender over the gallbladder during the study.  Common bile duct:   Normal caliber, 5 mm.  Liver:  No focal lesion identified.  Within normal limits in parenchymal echogenicity.  IVC:  Appears normal.  Pancreas:  No focal abnormality seen.  Spleen:  Within normal limits in size and echotexture.  Right Kidney:   Normal in size and parenchymal echogenicity.  No evidence of mass or hydronephrosis.  Left Kidney:  Normal in size and parenchymal echogenicity.  No evidence of mass or hydronephrosis.  Abdominal aorta:  No aneurysm identified.  IMPRESSION: Cholelithiasis.  No wall thickening or pericholecystic fluid, but the patient was tender  over the gallbladder during the examination.   Original Report Authenticated By: Charlett Nose, M.D.     Procedures Laparoscopic Cholecystectomy with [facility]  Hospital Course:  36 y/o female who presented to [facility] with abdominal pain, nausea and vomiting.  Workup showed cholelithiasis, but no cholecystitis.  Patient was admitted and underwent procedure listed above. She was found to have acute cholecystitis during surgery and cholethiasis.  Tolerated procedure well and was transferred to the floor.  Diet was advanced as tolerated.  On POD1, the patient was voiding well, tolerating diet, ambulating well, pain well controlled, vital signs stable, incisions c/d/i and felt stable for discharge home.  Patient will follow up in our office in 2-3 weeks and knows to call with questions or concerns.  Physical Exam: General:  Alert, NAD, pleasant, comfortable Abd:  Soft, ND, appropriate tenderness, incisions C/D/I    Medication List     As of 05/18/2012  8:33 AM    TAKE these medications         famotidine 20 MG tablet   Commonly known as: PEPCID   Take 1 tablet (20 mg total) by mouth 2 (two) times daily.      omeprazole 20 MG capsule   Commonly known as: PRILOSEC   Take 1 capsule (20 mg total) by mouth daily.      oxyCODONE-acetaminophen 5-325 MG per tablet   Commonly known as: PERCOCET/ROXICET   Take 0.5-2 tablets by mouth every 4 (four) hours as needed for pain.             Follow-up Information    Follow up with [facility]. On 06/03/2012. (Appointment on Dec 17th at 2:15pm, please arrive at 1:45pm for  check in)    Contact information:   [address]   [place] [address] [phone]          The patient should continue to "pump and dump" for another 1-2 days prior to resuming breastfeeding.   Signed: Aris [place], [facility] [phone]  05/18/2012, 8:33 AM

## 2012-05-18 NOTE — Discharge Summary (Signed)
Kimberly Eslinger M. Ziah Turvey, MD, FACS General, Bariatric, & Minimally Invasive Surgery [facility]  

## 2012-05-19 ENCOUNTER — Encounter (HOSPITAL_COMMUNITY): Payer: Self-pay | Admitting: General Surgery

## 2012-06-03 ENCOUNTER — Ambulatory Visit (INDEPENDENT_AMBULATORY_CARE_PROVIDER_SITE_OTHER): Payer: Self-pay | Admitting: General Surgery

## 2012-06-03 ENCOUNTER — Encounter (INDEPENDENT_AMBULATORY_CARE_PROVIDER_SITE_OTHER): Payer: Self-pay

## 2012-06-03 VITALS — BP 112/78 | HR 83 | Temp 97.6°F | Ht 66.0 in | Wt 176.6 lb

## 2012-06-03 DIAGNOSIS — K8010 Calculus of gallbladder with chronic cholecystitis without obstruction: Secondary | ICD-10-CM

## 2012-06-03 NOTE — Patient Instructions (Signed)
[facility] ______[facility] LAPAROSCOPIC SURGERY: POST OP INSTRUCTIONS Always review your discharge instruction sheet given to you by the facility where your surgery was performed. IF YOU HAVE DISABILITY OR FAMILY LEAVE FORMS, YOU MUST BRING THEM TO THE OFFICE FOR PROCESSING.   DO NOT GIVE THEM TO YOUR DOCTOR.  1. A prescription for pain medication may be given to you upon discharge.  Take your pain medication as prescribed, if needed.  If narcotic pain medicine is not needed, then you may take acetaminophen (Tylenol) or ibuprofen (Advil) as needed. 2. Take your usually prescribed medications unless otherwise directed. 3. If you need a refill on your pain medication, please contact your pharmacy.  They will contact our office to request authorization. Prescriptions will not be filled after 5pm or on week-ends. 4. You should follow a light diet the first few days after arrival home, such as soup and crackers, etc.  Be sure to include lots of fluids daily. 5. Most patients will experience some swelling and bruising in the area of the incisions.  Ice packs will help.  Swelling and bruising can take several days to resolve.  6. It is common to experience some constipation if taking pain medication after surgery.  Increasing fluid intake and taking a stool softener (such as Colace) will usually help or prevent this problem from occurring.  A mild laxative (Milk of Magnesia or Miralax) should be taken according to package instructions if there are no bowel movements after 48 hours. 7. Unless discharge instructions indicate otherwise, you may remove your bandages 24-48 hours after surgery, and you may shower at that time.  You may have steri-strips (small skin tapes) in place directly over the incision.  These strips should be left on the skin for 7-10 days.  If your surgeon used skin glue on the incision, you may shower in 24 hours.  The glue will flake off over the next 2-3 weeks.  Any sutures or  staples will be removed at the office during your follow-up visit. 8. ACTIVITIES:  You may resume regular (light) daily activities beginning the next day-such as daily self-care, walking, climbing stairs-gradually increasing activities as tolerated.  You may have sexual intercourse when it is comfortable.  Refrain from any heavy lifting or straining until approved by your doctor. a. You may drive when you are no longer taking prescription pain medication, you can comfortably wear a seatbelt, and you can safely maneuver your car and apply brakes. b. RETURN TO WORK:  __________________________________________________________ 9. You should see your doctor in the office for a follow-up appointment approximately 2-3 weeks after your surgery.  Make sure that you call for this appointment within a day or two after you arrive home to insure a convenient appointment time. 10. OTHER INSTRUCTIONS: __________________________________________________________________________________________________________________________ __________________________________________________________________________________________________________________________ WHEN TO CALL YOUR DOCTOR: 1. Fever over 101.0 2. Inability to urinate 3. Continued bleeding from incision. 4. Increased pain, redness, or drainage from the incision. 5. Increasing abdominal pain  The clinic staff is available to answer your questions during regular business hours.  Please don't hesitate to call and ask to speak to one of the nurses for clinical concerns.  If you have a medical emergency, go to the nearest emergency room or call 911.  A surgeon from [facility] is always on call at the hospital. [address], [place], [place]  [address] ? [address] [address], [place], [place]   [address] [phone] ? [phone] ? FAX [phone] Web site:  [email or web address]  Call if you have a problem.

## 2012-06-03 NOTE — Progress Notes (Signed)
[facility] 03/13/1976 [id number] 06/03/2012   Kimberly Copeland is a 36 y.o. female who had a laparoscopic cholecystectomy with intraoperative cholangiogram by Dr.Burke Janee Morn.  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  The patient reports that they are feeling well with normal bowel movements and good appetite.  The pre-operative symptoms of abdominal pain, nausea, and vomiting have resolved.  She still has some reflux/Gerd, worse with spicy foods.  i toldt her to avoid these.  Her son is here to translate.  Physical examination - Incisions appear well-healed with no sign of infection or bleeding.   Abdomen - soft, non-tender Prior to Admission medications   Medication Sig Start Date End Date Taking? Authorizing Provider  famotidine (PEPCID) 20 MG tablet Take 1 tablet (20 mg total) by mouth 2 (two) times daily. 04/30/12  Yes Lisette Paz, PA-C  omeprazole (PRILOSEC) 20 MG capsule Take 1 capsule (20 mg total) by mouth daily. 04/30/12  Yes Lisette Paz, PA-C  oxyCODONE-acetaminophen (ROXICET) 5-325 MG per tablet Take 0.5-2 tablets by mouth every 4 (four) hours as needed for pain. 05/18/12  Yes Megan Dort, PA-C   BP 112/78  Pulse 83  Temp 97.6 F (36.4 C) (Temporal)  Ht 5\' 6"  (1.676 m)  Wt 176 lb 9.6 oz (80.105 kg)  BMI 28.50 kg/m2  SpO2 98% Impression:  s/p laparoscopic cholecystectomy  Plan:  She may resume a regular diet and full activity.  She may follow-up on a PRN basis.

## 2014-04-14 IMAGING — US US ABDOMEN COMPLETE
1 series · 14 of 25 positions shown · non-contrast
Comparison: CT 07/31/2009

CLINICAL DATA: Elevated liver function tests, 28 weeks pregnant

ABDOMINAL ULTRASOUND COMPLETE

[Series 1: us abdomen complete · 0.30mm/px · 14 of 49 slices shown]
[im 1/49]
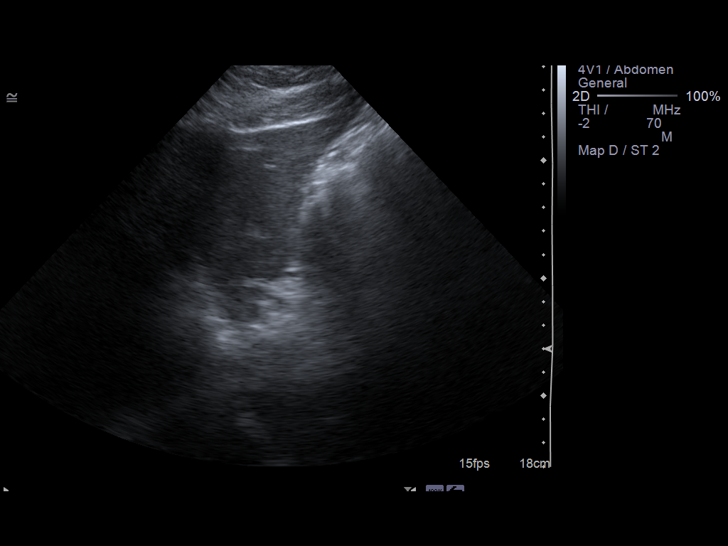
[im 5/49]
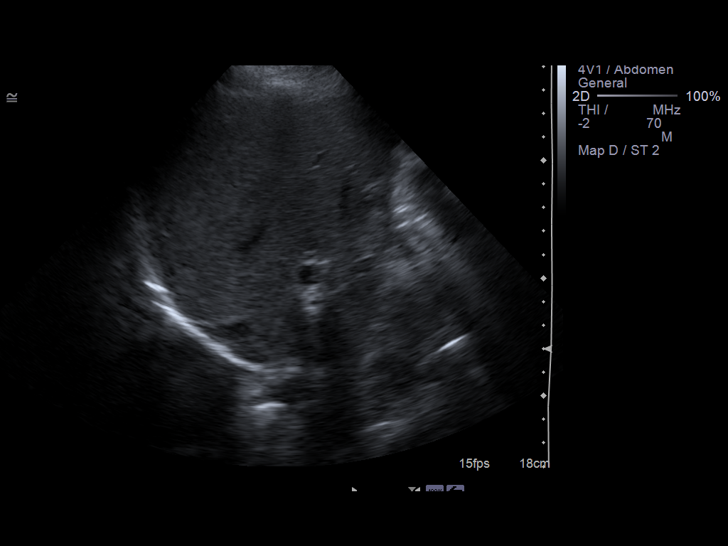
[im 9/49]
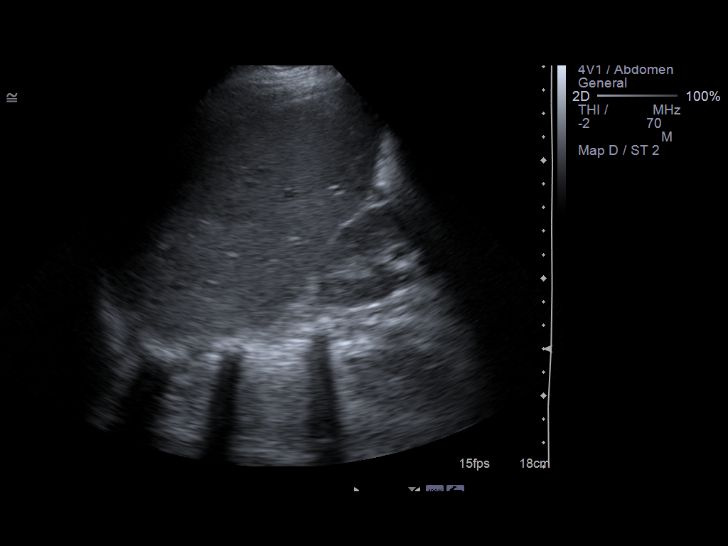
[im 13/49]
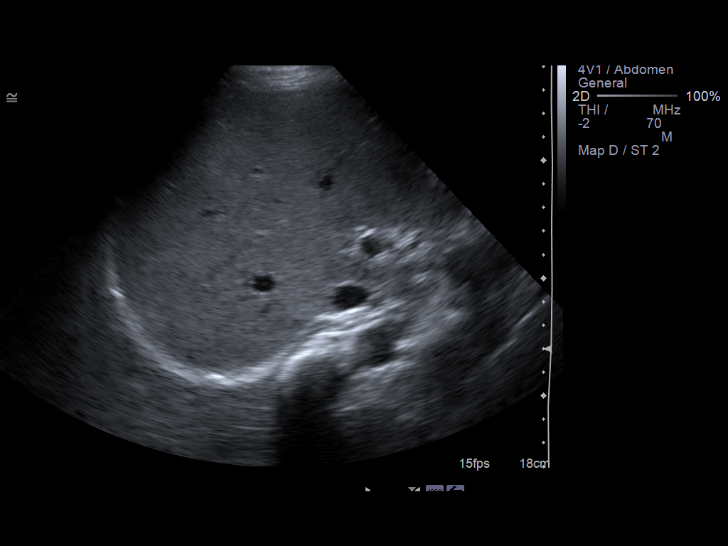
[im 17/49]
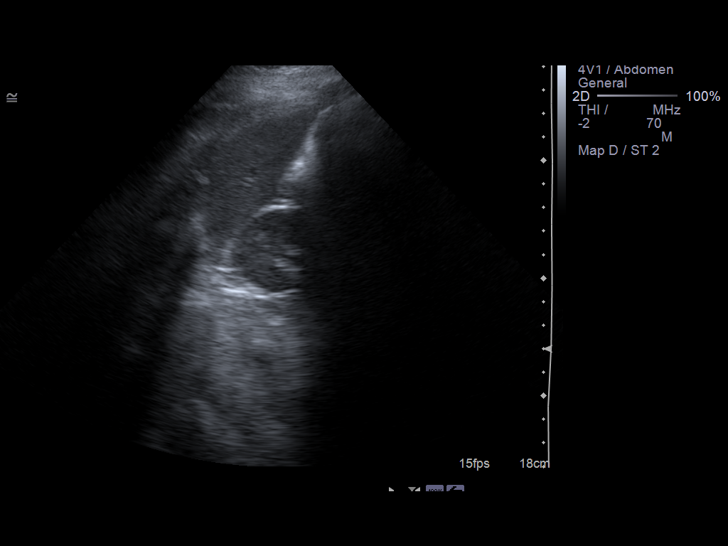
[im 19/49]
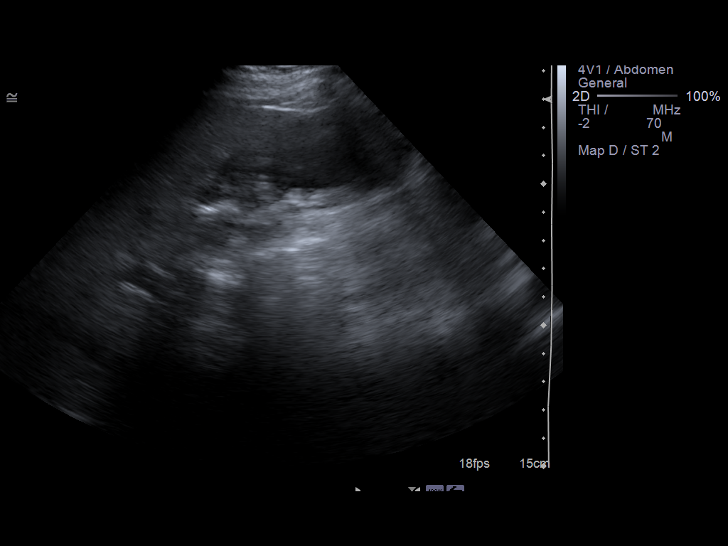
[im 23/49]
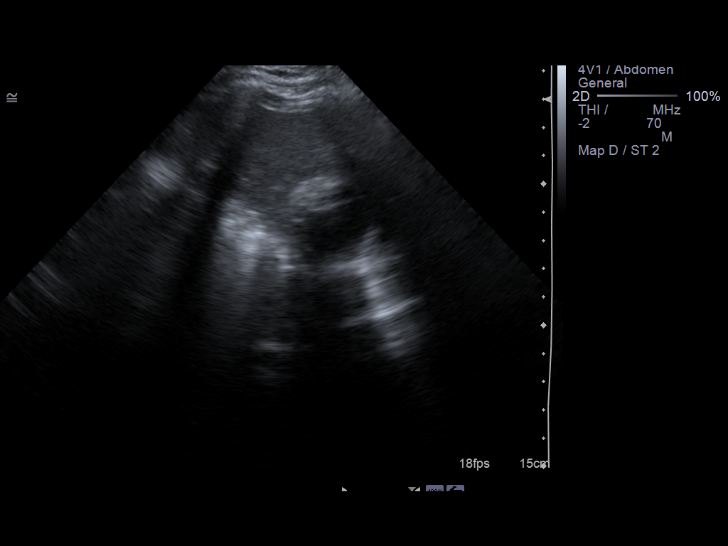
[im 27/49]
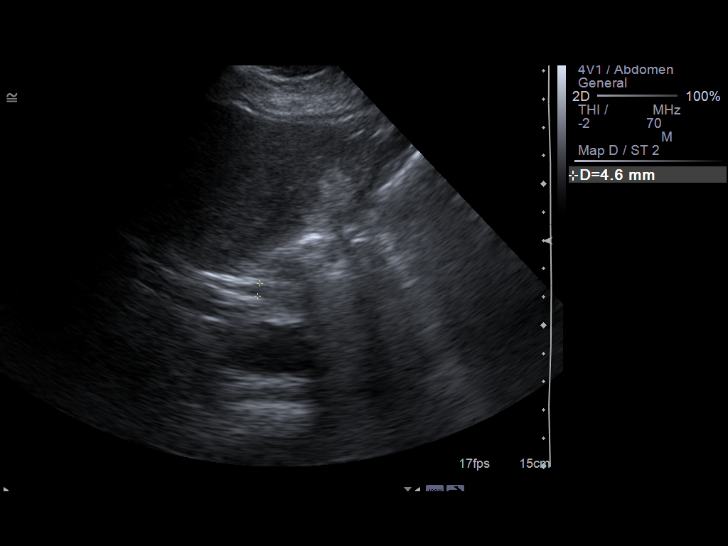
[im 31/49]
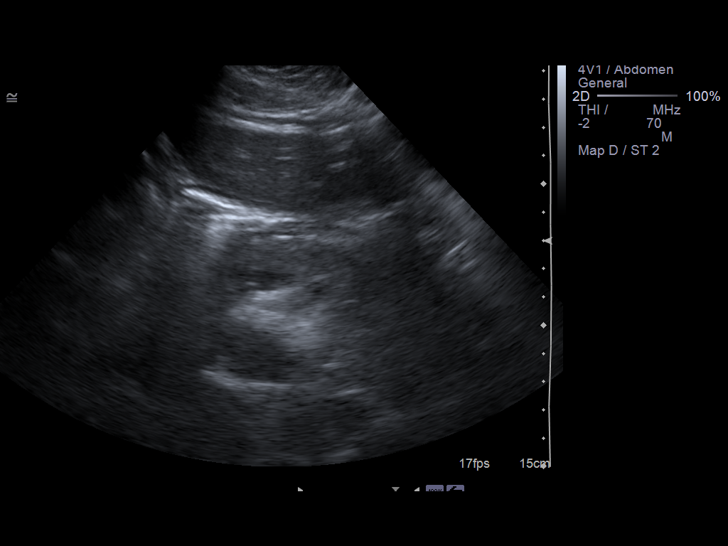
[im 33/49]
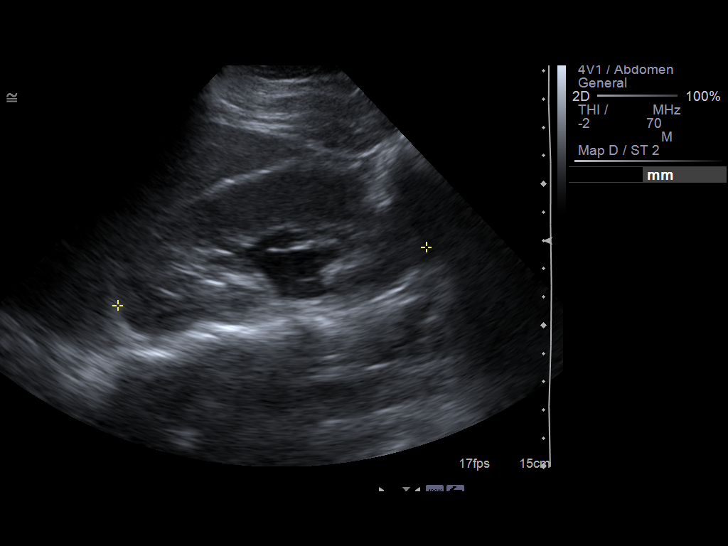
[im 37/49]
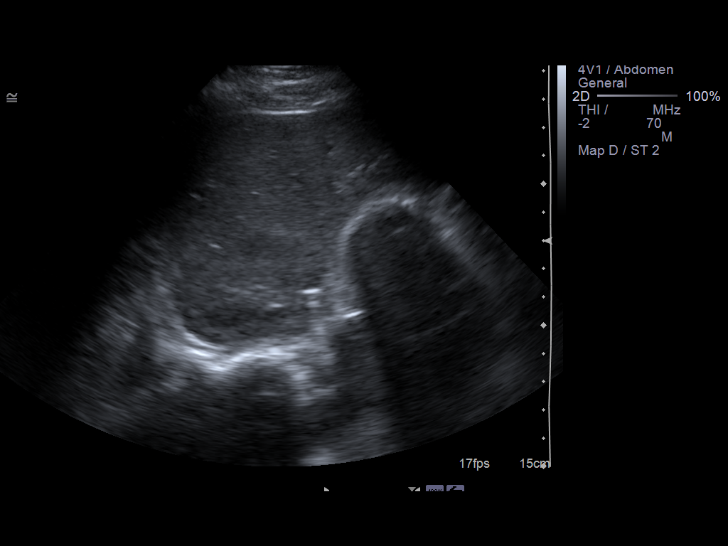
[im 41/49]
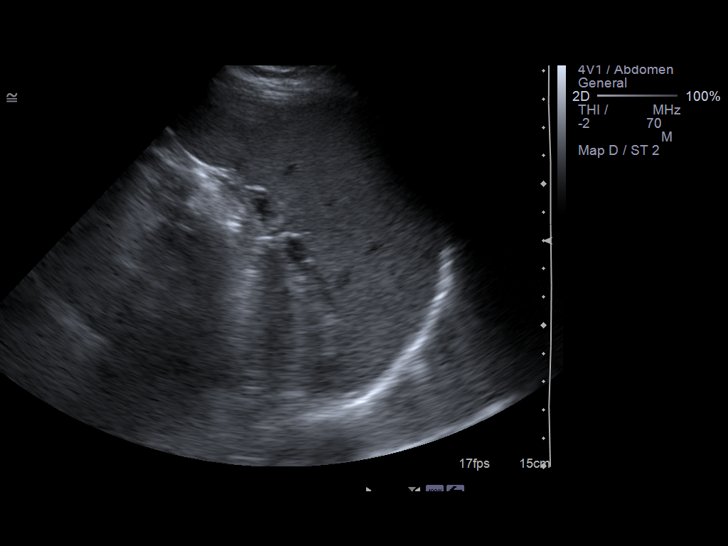
[im 45/49]
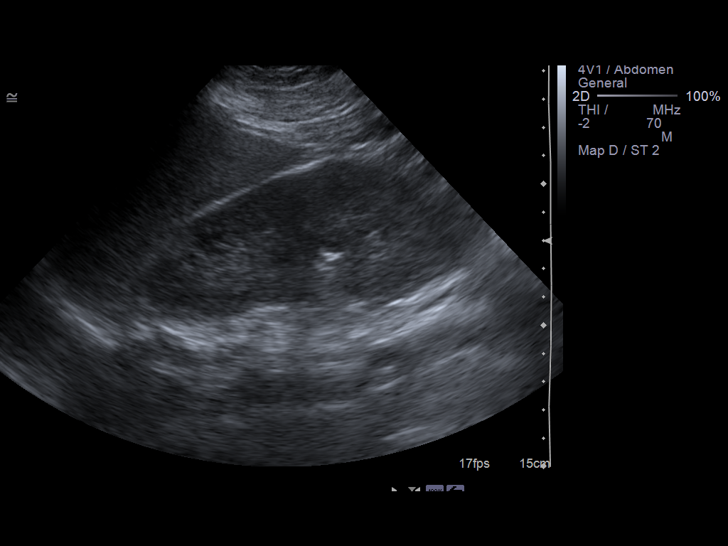
[im 49/49]
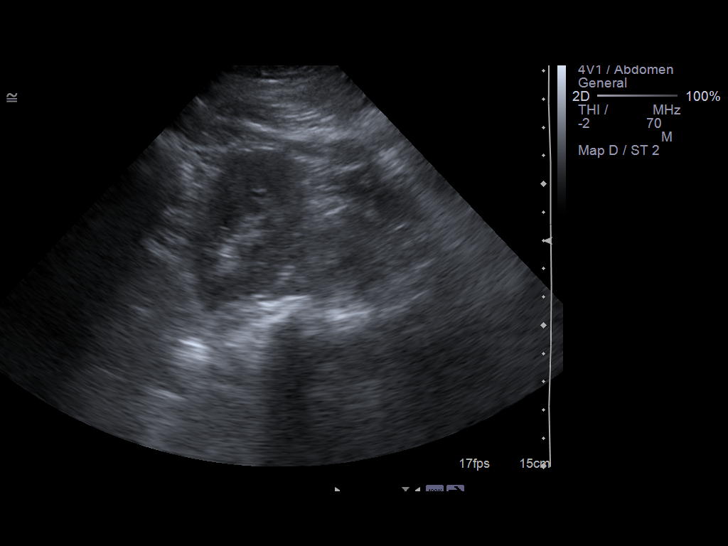

[14 of 25 positions shown; findings below may reference images not displayed]

FINDINGS: Gallbladder:  Multiple mobile gallstones noted, largest 7 mm.  No
gallbladder wall thickening, pericholecystic fluid, or sonographic
Murphy's sign.

Common Bile Duct:  Within normal limits in caliber.

Liver: No focal mass lesion identified.  Within normal limits in
parenchymal echogenicity.

IVC:  Appears normal.

Pancreas:  Obscured by bowel gas.

Spleen:  Within normal limits in size and echotexture.

Right kidney:  Normal in size.  Minimal fullness of the renal
pelvis is compatible with the patient's gravid state.

Left kidney:  Normal in size and parenchymal echogenicity.  No
evidence of mass or hydronephrosis.

Abdominal Aorta:  No aneurysm identified.
IMPRESSION: Gallstones without other secondary sonographic evidence for acute
cholecystitis.

## 2014-04-19 ENCOUNTER — Encounter (INDEPENDENT_AMBULATORY_CARE_PROVIDER_SITE_OTHER): Payer: Self-pay

## 2014-04-25 IMAGING — US US FETAL BPP W/O NONSTRESS
1 series · 13 of 23 positions shown · non-contrast
Comparison: none

[Series 1: us fetal bpp w/o nonstress · non-contrast · 23 acquisitions, 13 frames shown]
[im 1/23]
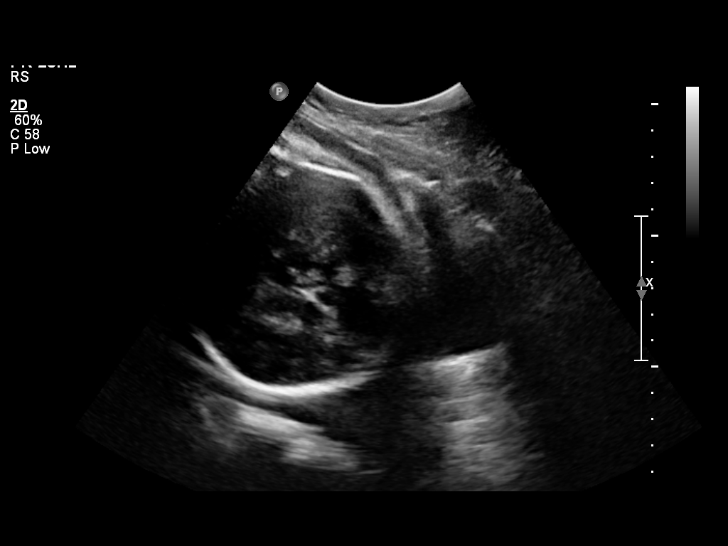
[im 3/23]
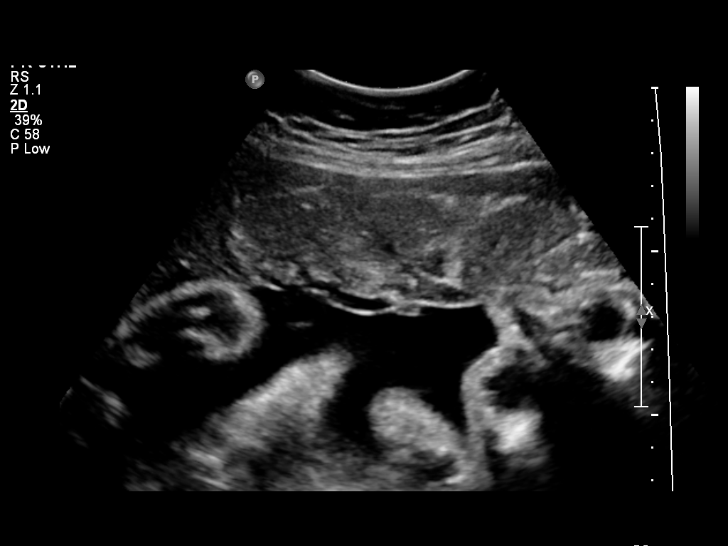
[im 5/23]
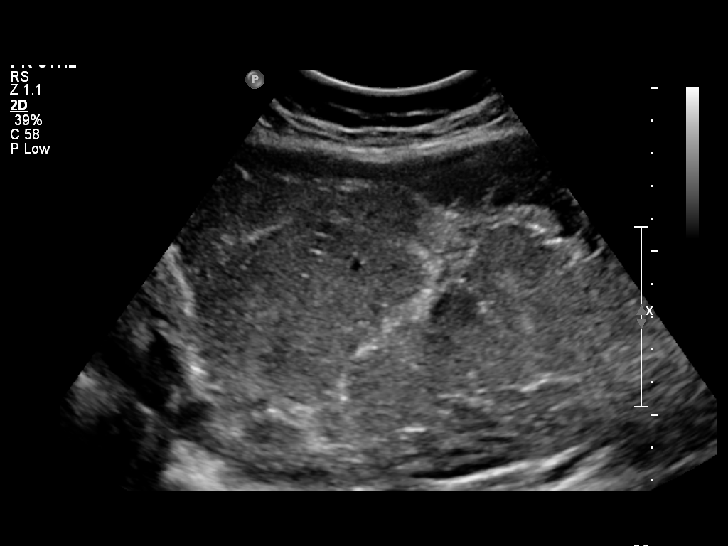
[im 7/23]
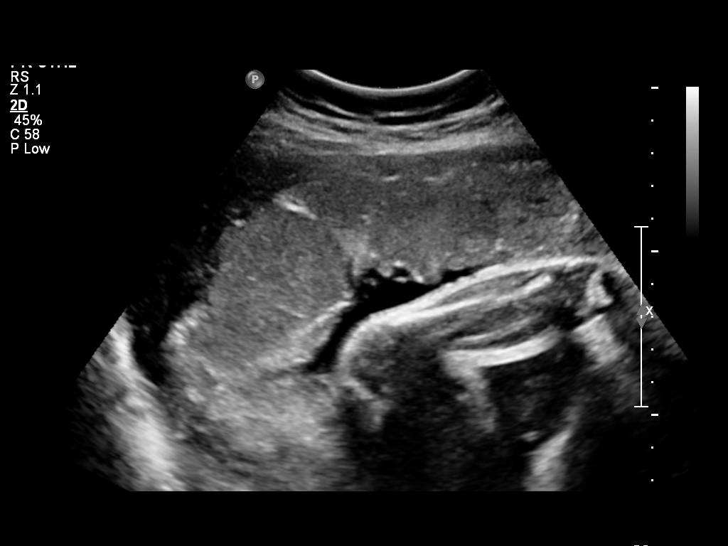
[im 8/23]
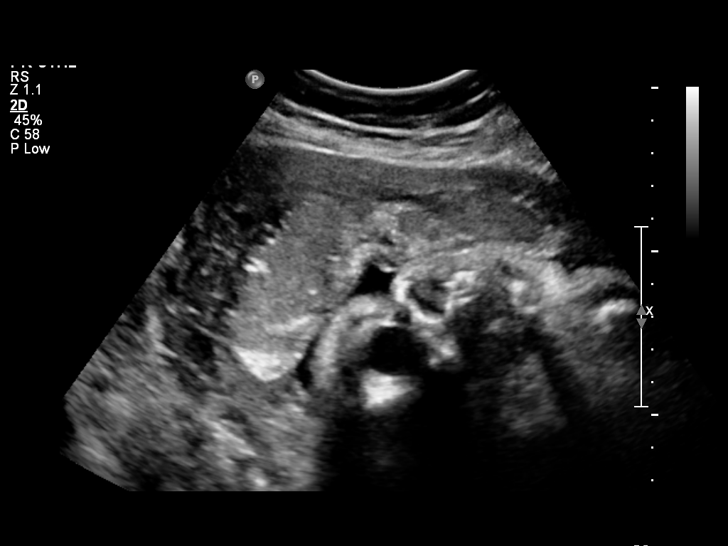
[im 10/23]
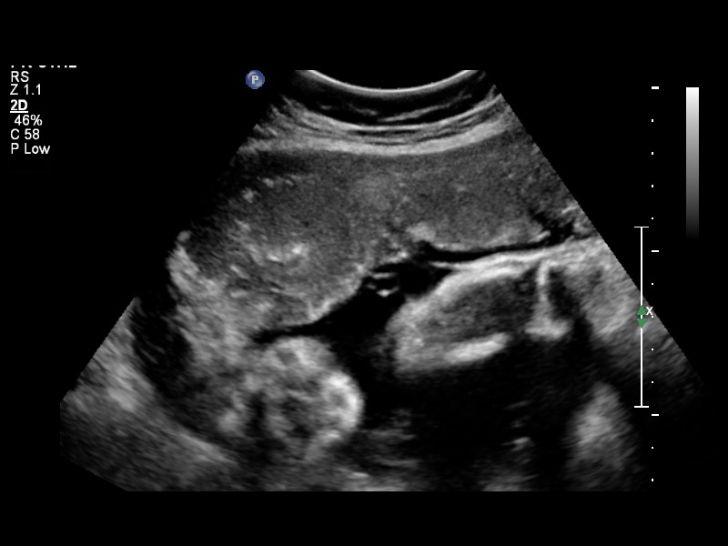
[im 12/23]
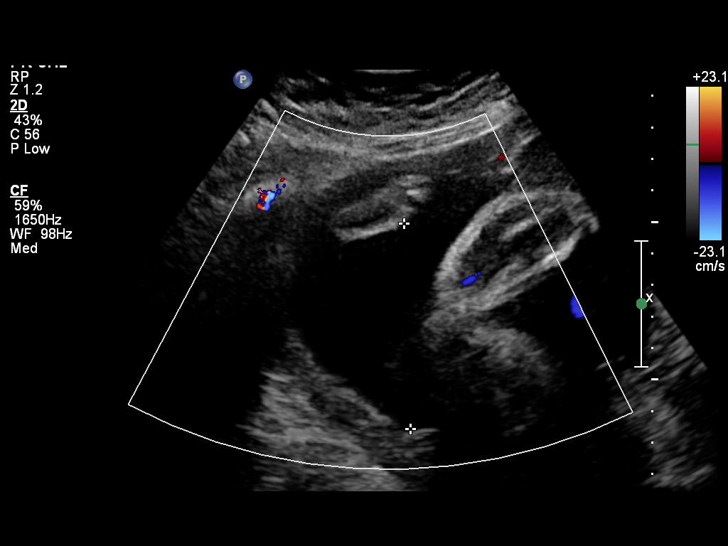
[im 14/23]
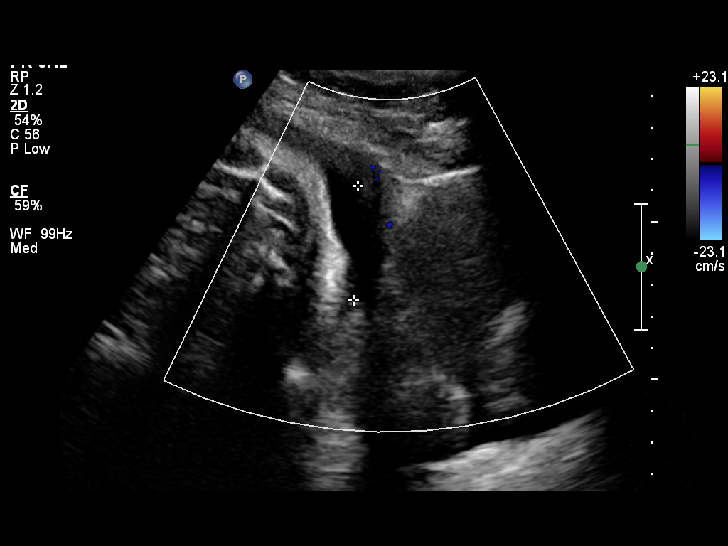
[im 16/23]
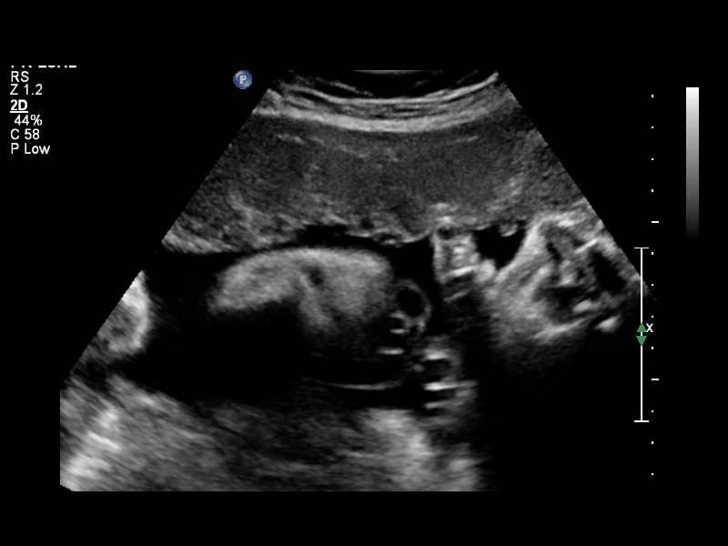
[im 17/23]
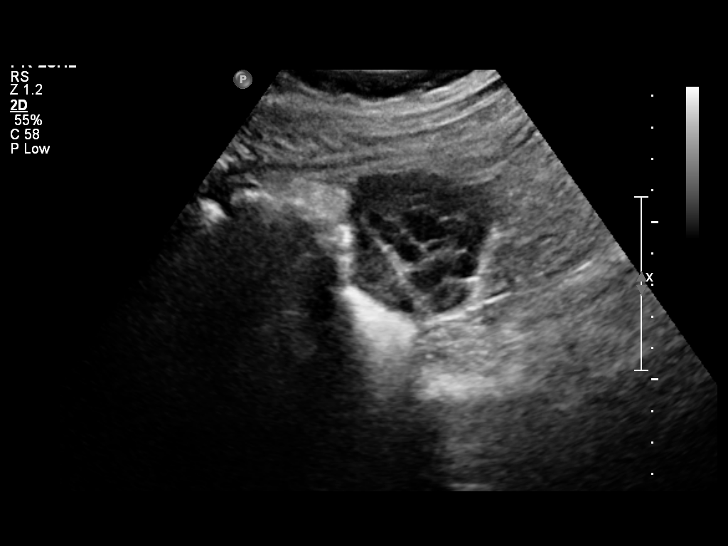
[im 19/23]
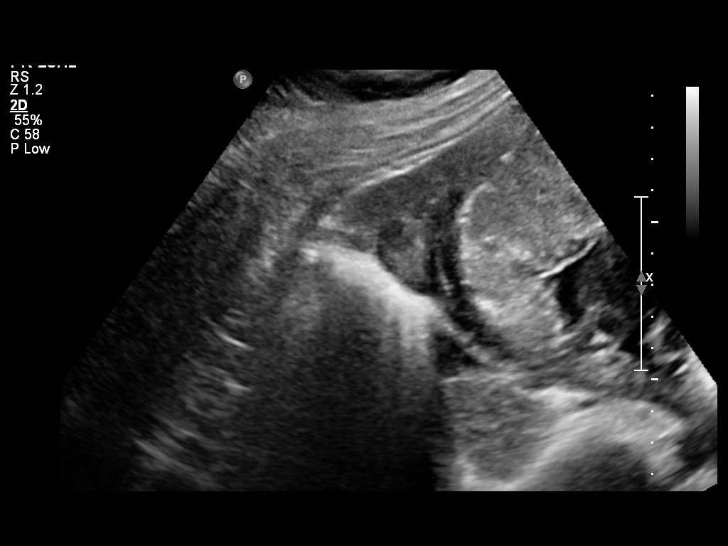
[im 21/23]
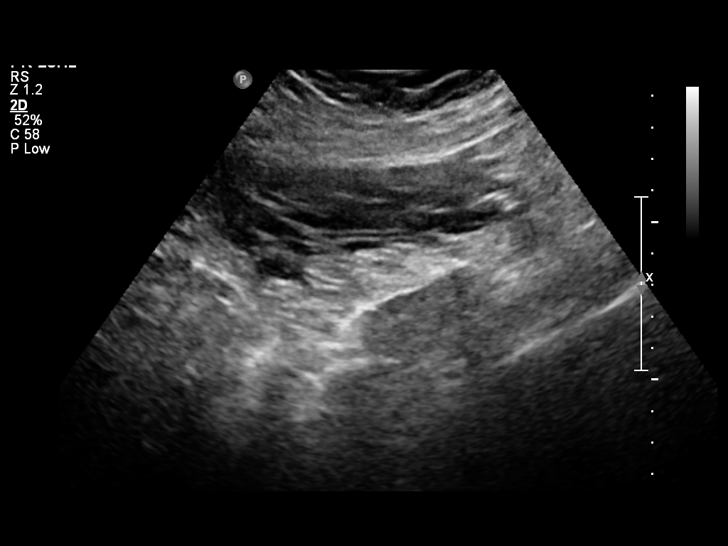
[im 23/23]
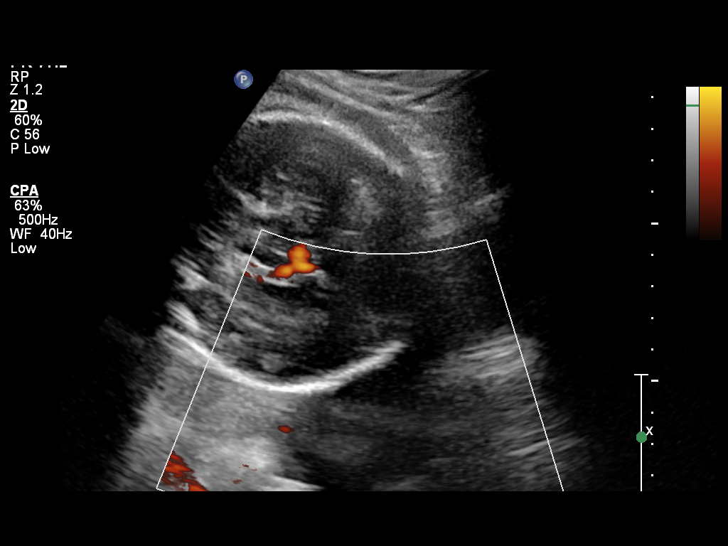

[13 of 23 positions shown; findings below may reference images not displayed]

OBSTETRICS REPORT
                      (Signed Final 10/22/2011 [DATE])

 Order#:         [id number]
                 70_O
Procedures

 [HOSPITAL]                                         76815.0
Indications

 Assess fetal well being
 Assess amniotic fluid volume
 Maternal Intrahepatic cholestasis
Fetal Evaluation

 Fetal Heart Rate:  140                         bpm
 Cardiac Activity:  Observed
 Presentation:      Cephalic
 Placenta:          Anterior, above cervical os
 P. Cord            Previously Visualized
 Insertion:

 Comment:    BPP score [DATE] in 5 minutes.

 Amniotic Fluid
 AFI FV:      Subjectively within normal limits
 AFI Sum:     14.92   cm      54   %Tile     Larg Pckt:     6.5  cm
 RUQ:   6.5    cm    RLQ:    1.55   cm    LUQ:   3.62    cm   LLQ:    3.25   cm
Biophysical Evaluation

 Amniotic F.V:   Pocket => 2 cm two         F. Tone:        Observed
                 planes
 F. Movement:    Observed                   Score:          [DATE]
 F. Breathing:   Observed
Gestational Age

 LMP:           35w 0d       Date:   02/19/11                 EDD:   11/26/11
 Best:          35w 0d    Det. By:   LMP  (02/19/11)          EDD:   11/26/11
Cervix Uterus Adnexa

 Cervix:       Not visualized (advanced GA >34 wks)
 Left Ovary:   Not visualized.
 Right Ovary:  Within normal limits.
 Adnexa:     No abnormality visualized.
Impression

 Single live IUP in cephalic presentation.  BPP [DATE].

 questions or concerns.

## 2016-10-08 ENCOUNTER — Encounter (HOSPITAL_COMMUNITY): Payer: Self-pay

## 2016-10-08 ENCOUNTER — Observation Stay (HOSPITAL_COMMUNITY)
Admission: EM | Admit: 2016-10-08 | Discharge: 2016-10-10 | Disposition: A | Discharge status: Home / Self Care | Payer: Self-pay | Attending: Surgery | Admitting: Surgery

## 2016-10-08 DIAGNOSIS — K353 Acute appendicitis with localized peritonitis, without perforation or gangrene: Secondary | ICD-10-CM

## 2016-10-08 DIAGNOSIS — K3580 Unspecified acute appendicitis: Secondary | ICD-10-CM | POA: Diagnosis present

## 2016-10-08 LAB — URINALYSIS, ROUTINE W REFLEX MICROSCOPIC
Bilirubin Urine: NEGATIVE
Glucose, UA: NEGATIVE mg/dL
Hgb urine dipstick: NEGATIVE
KETONES UR: 5 mg/dL — AB
LEUKOCYTES UA: NEGATIVE
NITRITE: NEGATIVE
PH: 6 (ref 5.0–8.0)
Protein, ur: NEGATIVE mg/dL
Specific Gravity, Urine: 1.026 (ref 1.005–1.030)

## 2016-10-08 LAB — COMPREHENSIVE METABOLIC PANEL
ALT: 21 U/L (ref 14–54)
ANION GAP: 13 (ref 5–15)
AST: 23 U/L (ref 15–41)
Albumin: 4.4 g/dL (ref 3.5–5.0)
Alkaline Phosphatase: 58 U/L (ref 38–126)
BILIRUBIN TOTAL: 0.9 mg/dL (ref 0.3–1.2)
BUN: 11 mg/dL (ref 6–20)
CALCIUM: 9.3 mg/dL (ref 8.9–10.3)
CO2: 25 mmol/L (ref 22–32)
Chloride: 98 mmol/L — ABNORMAL LOW (ref 101–111)
Creatinine, Ser: 0.63 mg/dL (ref 0.44–1.00)
GFR calc Af Amer: >60 mL/min (ref >60)
Glucose, Bld: 109 mg/dL — ABNORMAL HIGH (ref 65–99)
POTASSIUM: 3.9 mmol/L (ref 3.5–5.1)
Sodium: 136 mmol/L (ref 135–145)
TOTAL PROTEIN: 7.4 g/dL (ref 6.5–8.1)

## 2016-10-08 LAB — LIPASE, BLOOD: Lipase: 21 U/L (ref 11–51)

## 2016-10-08 LAB — CBC
HCT: 39 % (ref 36.0–46.0)
HEMOGLOBIN: 12.7 g/dL (ref 12.0–15.0)
MCH: 27.2 pg (ref 26.0–34.0)
MCHC: 32.6 g/dL (ref 30.0–36.0)
MCV: 83.5 fL (ref 78.0–100.0)
Platelets: 277 10*3/uL (ref 150–400)
RBC: 4.67 MIL/uL (ref 3.87–5.11)
RDW: 13.2 % (ref 11.5–15.5)
WBC: 16.1 10*3/uL — AB (ref 4.0–10.5)

## 2016-10-08 LAB — I-STAT BETA HCG BLOOD, ED (MC, WL, AP ONLY)

## 2016-10-08 MED ORDER — IOPAMIDOL (ISOVUE-300) INJECTION 61%
INTRAVENOUS | Status: AC
  Administered 2016-10-09: 100 mL
  Filled 2016-10-08: qty 100

## 2016-10-08 MED ORDER — SODIUM CHLORIDE 0.9 % IV BOLUS (SEPSIS)
1000.0000 mL | Freq: Once | INTRAVENOUS | Status: AC
  Administered 2016-10-08: 1000 mL via INTRAVENOUS

## 2016-10-08 MED ORDER — ONDANSETRON HCL 4 MG/2ML IJ SOLN
4.0000 mg | Freq: Once | INTRAMUSCULAR | Status: AC
  Administered 2016-10-08: 4 mg via INTRAVENOUS
  Filled 2016-10-08: qty 2

## 2016-10-08 MED ORDER — FENTANYL CITRATE (PF) 100 MCG/2ML IJ SOLN
50.0000 ug | Freq: Once | INTRAMUSCULAR | Status: AC
  Administered 2016-10-08: 50 ug via INTRAVENOUS
  Filled 2016-10-08: qty 2

## 2016-10-08 NOTE — ED Notes (Signed)
Patient transported to CT 

## 2016-10-08 NOTE — ED Triage Notes (Signed)
Pt began having abdominal pain at 1300 today. She reports some nausea vomiting and diarrhea as well.

## 2016-10-09 ENCOUNTER — Observation Stay (HOSPITAL_COMMUNITY): Payer: Self-pay | Admitting: Certified Registered Nurse Anesthetist

## 2016-10-09 ENCOUNTER — Encounter (HOSPITAL_COMMUNITY)
Admission: EM | Disposition: A | Discharge status: Home / Self Care | Payer: Self-pay | Source: Home / Self Care | Attending: Emergency Medicine

## 2016-10-09 ENCOUNTER — Encounter (HOSPITAL_COMMUNITY): Payer: Self-pay

## 2016-10-09 ENCOUNTER — Emergency Department (HOSPITAL_COMMUNITY): Payer: Self-pay

## 2016-10-09 ENCOUNTER — Encounter (HOSPITAL_COMMUNITY): Payer: Self-pay | Admitting: Radiology

## 2016-10-09 DIAGNOSIS — K3580 Unspecified acute appendicitis: Secondary | ICD-10-CM | POA: Diagnosis present

## 2016-10-09 HISTORY — PX: LAPAROSCOPIC APPENDECTOMY: SHX408

## 2016-10-09 LAB — HIV ANTIBODY (ROUTINE TESTING W REFLEX): HIV Screen 4th Generation wRfx: NONREACTIVE

## 2016-10-09 SURGERY — APPENDECTOMY, LAPAROSCOPIC
Anesthesia: General | Site: Abdomen

## 2016-10-09 MED ORDER — HYDROMORPHONE HCL 1 MG/ML IJ SOLN
0.2500 mg | INTRAMUSCULAR | Status: DC | PRN
  Administered 2016-10-09 (×2): 0.5 mg via INTRAVENOUS

## 2016-10-09 MED ORDER — ONDANSETRON 4 MG PO TBDP
4.0000 mg | ORAL_TABLET | Freq: Four times a day (QID) | ORAL | Status: DC | PRN
Start: 2016-10-09 — End: 2016-10-10

## 2016-10-09 MED ORDER — PHENYLEPHRINE 40 MCG/ML (10ML) SYRINGE FOR IV PUSH (FOR BLOOD PRESSURE SUPPORT)
PREFILLED_SYRINGE | INTRAVENOUS | Status: DC | PRN
  Administered 2016-10-09: 80 ug via INTRAVENOUS

## 2016-10-09 MED ORDER — PROMETHAZINE HCL 25 MG/ML IJ SOLN: 6.2500 mg | INTRAMUSCULAR | Status: DC | PRN

## 2016-10-09 MED ORDER — PROPOFOL 10 MG/ML IV BOLUS
INTRAVENOUS | Status: DC | PRN
  Administered 2016-10-09: 160 mg via INTRAVENOUS

## 2016-10-09 MED ORDER — HYDROMORPHONE HCL 1 MG/ML IJ SOLN
INTRAMUSCULAR | Status: AC
  Filled 2016-10-09: qty 0.5

## 2016-10-09 MED ORDER — SUCCINYLCHOLINE CHLORIDE 200 MG/10ML IV SOSY
PREFILLED_SYRINGE | INTRAVENOUS | Status: DC | PRN
  Administered 2016-10-09: 100 mg via INTRAVENOUS

## 2016-10-09 MED ORDER — DEXTROSE-NACL 5-0.9 % IV SOLN
INTRAVENOUS | Status: DC
  Administered 2016-10-09 (×3): via INTRAVENOUS

## 2016-10-09 MED ORDER — ONDANSETRON HCL 4 MG/2ML IJ SOLN
INTRAMUSCULAR | Status: AC
  Filled 2016-10-09: qty 2

## 2016-10-09 MED ORDER — SODIUM CHLORIDE 0.9 % IR SOLN
Status: DC | PRN
  Administered 2016-10-09: 1

## 2016-10-09 MED ORDER — MIDAZOLAM HCL 2 MG/2ML IJ SOLN
INTRAMUSCULAR | Status: DC | PRN
  Administered 2016-10-09: 2 mg via INTRAVENOUS

## 2016-10-09 MED ORDER — LIDOCAINE 2% (20 MG/ML) 5 ML SYRINGE
INTRAMUSCULAR | Status: DC | PRN
  Administered 2016-10-09: 90 mg via INTRAVENOUS

## 2016-10-09 MED ORDER — BUPIVACAINE-EPINEPHRINE 0.5% -1:200000 IJ SOLN
INTRAMUSCULAR | Status: DC | PRN
  Administered 2016-10-09: 6 mL

## 2016-10-09 MED ORDER — ENOXAPARIN SODIUM 40 MG/0.4ML ~~LOC~~ SOLN: 40.0000 mg | SUBCUTANEOUS | Status: DC

## 2016-10-09 MED ORDER — BUPIVACAINE HCL (PF) 0.25 % IJ SOLN
INTRAMUSCULAR | Status: AC
  Filled 2016-10-09: qty 30

## 2016-10-09 MED ORDER — LIDOCAINE 2% (20 MG/ML) 5 ML SYRINGE
INTRAMUSCULAR | Status: AC
  Filled 2016-10-09: qty 5

## 2016-10-09 MED ORDER — SUGAMMADEX SODIUM 200 MG/2ML IV SOLN
INTRAVENOUS | Status: AC
  Filled 2016-10-09: qty 2

## 2016-10-09 MED ORDER — ONDANSETRON HCL 4 MG/2ML IJ SOLN
4.0000 mg | Freq: Four times a day (QID) | INTRAMUSCULAR | Status: DC | PRN
  Administered 2016-10-10: 4 mg via INTRAVENOUS
  Filled 2016-10-09: qty 2

## 2016-10-09 MED ORDER — DEXAMETHASONE SODIUM PHOSPHATE 10 MG/ML IJ SOLN
INTRAMUSCULAR | Status: DC | PRN
  Administered 2016-10-09: 10 mg via INTRAVENOUS

## 2016-10-09 MED ORDER — FENTANYL CITRATE (PF) 250 MCG/5ML IJ SOLN
INTRAMUSCULAR | Status: AC
  Filled 2016-10-09: qty 5

## 2016-10-09 MED ORDER — METRONIDAZOLE IN NACL 5-0.79 MG/ML-% IV SOLN
500.0000 mg | Freq: Once | INTRAVENOUS | Status: AC
  Administered 2016-10-09: 500 mg via INTRAVENOUS
  Filled 2016-10-09: qty 100

## 2016-10-09 MED ORDER — DEXAMETHASONE SODIUM PHOSPHATE 10 MG/ML IJ SOLN
INTRAMUSCULAR | Status: AC
  Filled 2016-10-09: qty 1

## 2016-10-09 MED ORDER — ROCURONIUM BROMIDE 10 MG/ML (PF) SYRINGE
PREFILLED_SYRINGE | INTRAVENOUS | Status: DC | PRN
  Administered 2016-10-09: 30 mg via INTRAVENOUS

## 2016-10-09 MED ORDER — DEXTROSE 5 % IV SOLN
2.0000 g | Freq: Once | INTRAVENOUS | Status: AC
  Administered 2016-10-09: 2 g via INTRAVENOUS
  Filled 2016-10-09: qty 2

## 2016-10-09 MED ORDER — LACTATED RINGERS IV SOLN
INTRAVENOUS | Status: DC
  Administered 2016-10-09 (×2): via INTRAVENOUS

## 2016-10-09 MED ORDER — SUGAMMADEX SODIUM 200 MG/2ML IV SOLN
INTRAVENOUS | Status: DC | PRN
  Administered 2016-10-09: 100 mg via INTRAVENOUS

## 2016-10-09 MED ORDER — 0.9 % SODIUM CHLORIDE (POUR BTL) OPTIME
TOPICAL | Status: DC | PRN
  Administered 2016-10-09: 1000 mL

## 2016-10-09 MED ORDER — HYDROMORPHONE HCL 1 MG/ML IJ SOLN: 0.5000 mg | INTRAMUSCULAR | Status: DC | PRN

## 2016-10-09 MED ORDER — ONDANSETRON HCL 4 MG/2ML IJ SOLN
INTRAMUSCULAR | Status: DC | PRN
  Administered 2016-10-09: 4 mg via INTRAVENOUS

## 2016-10-09 MED ORDER — OXYCODONE HCL 5 MG PO TABS
5.0000 mg | ORAL_TABLET | ORAL | Status: DC | PRN
  Administered 2016-10-09 – 2016-10-10 (×4): 10 mg via ORAL
  Filled 2016-10-09 (×4): qty 2

## 2016-10-09 MED ORDER — PROPOFOL 10 MG/ML IV BOLUS
INTRAVENOUS | Status: AC
  Filled 2016-10-09: qty 20

## 2016-10-09 MED ORDER — MIDAZOLAM HCL 2 MG/2ML IJ SOLN
INTRAMUSCULAR | Status: AC
  Filled 2016-10-09: qty 2

## 2016-10-09 MED ORDER — FENTANYL CITRATE (PF) 100 MCG/2ML IJ SOLN
INTRAMUSCULAR | Status: DC | PRN
Start: 2016-10-09 — End: 2016-10-09
  Administered 2016-10-09 (×4): 50 ug via INTRAVENOUS

## 2016-10-09 SURGICAL SUPPLY — 39 items
APPLIER CLIP ROT 10 11.4 M/L (STAPLE)
BLADE CLIPPER SURG (BLADE) IMPLANT
CANISTER SUCT 3000ML PPV (MISCELLANEOUS) ×3 IMPLANT
CHLORAPREP W/TINT 26ML (MISCELLANEOUS) ×3 IMPLANT
CLIP APPLIE ROT 10 11.4 M/L (STAPLE) IMPLANT
COVER SURGICAL LIGHT HANDLE (MISCELLANEOUS) ×3 IMPLANT
CUTTER FLEX LINEAR 45M (STAPLE) ×3 IMPLANT
DERMABOND ADVANCED (GAUZE/BANDAGES/DRESSINGS) ×2
DERMABOND ADVANCED .7 DNX12 (GAUZE/BANDAGES/DRESSINGS) ×1 IMPLANT
DRAPE WARM FLUID 44X44 (DRAPE) ×3 IMPLANT
ELECT REM PT RETURN 9FT ADLT (ELECTROSURGICAL) ×3
ELECTRODE REM PT RTRN 9FT ADLT (ELECTROSURGICAL) ×1 IMPLANT
ENDOLOOP SUT PDS II  0 18 (SUTURE)
ENDOLOOP SUT PDS II 0 18 (SUTURE) IMPLANT
GLOVE BIO SURGEON STRL SZ8 (GLOVE) ×3 IMPLANT
GLOVE BIOGEL PI IND STRL 8 (GLOVE) ×1 IMPLANT
GLOVE BIOGEL PI INDICATOR 8 (GLOVE) ×2
GOWN STRL REUS W/ TWL LRG LVL3 (GOWN DISPOSABLE) ×2 IMPLANT
GOWN STRL REUS W/ TWL XL LVL3 (GOWN DISPOSABLE) ×1 IMPLANT
GOWN STRL REUS W/TWL LRG LVL3 (GOWN DISPOSABLE) ×4
GOWN STRL REUS W/TWL XL LVL3 (GOWN DISPOSABLE) ×2
KIT BASIN OR (CUSTOM PROCEDURE TRAY) ×3 IMPLANT
KIT ROOM TURNOVER OR (KITS) ×3 IMPLANT
NS IRRIG 1000ML POUR BTL (IV SOLUTION) ×3 IMPLANT
PAD ARMBOARD 7.5X6 YLW CONV (MISCELLANEOUS) ×6 IMPLANT
POUCH SPECIMEN RETRIEVAL 10MM (ENDOMECHANICALS) ×3 IMPLANT
RELOAD STAPLE TA45 3.5 REG BLU (ENDOMECHANICALS) ×3 IMPLANT
SCISSORS LAP 5X35 DISP (ENDOMECHANICALS) ×3 IMPLANT
SET IRRIG TUBING LAPAROSCOPIC (IRRIGATION / IRRIGATOR) ×3 IMPLANT
SHEARS HARMONIC ACE PLUS 36CM (ENDOMECHANICALS) ×3 IMPLANT
SPECIMEN JAR SMALL (MISCELLANEOUS) ×3 IMPLANT
SUT MON AB 4-0 PC3 18 (SUTURE) ×3 IMPLANT
TOWEL OR 17X24 6PK STRL BLUE (TOWEL DISPOSABLE) ×3 IMPLANT
TOWEL OR 17X26 10 PK STRL BLUE (TOWEL DISPOSABLE) ×3 IMPLANT
TRAY FOLEY CATH SILVER 16FR (SET/KITS/TRAYS/PACK) ×3 IMPLANT
TRAY LAPAROSCOPIC MC (CUSTOM PROCEDURE TRAY) ×3 IMPLANT
TROCAR XCEL BLADELESS 5X75MML (TROCAR) ×6 IMPLANT
TROCAR XCEL BLUNT TIP 100MML (ENDOMECHANICALS) ×3 IMPLANT
TUBING INSUFFLATION (TUBING) ×3 IMPLANT

## 2016-10-09 NOTE — Anesthesia Postprocedure Evaluation (Addendum)
Anesthesia Post Note  Patient: Kimberly Copeland  Procedure(s) Performed: Procedure(s) (LRB): APPENDECTOMY LAPAROSCOPIC (N/A)  Patient location during evaluation: PACU Anesthesia Type: General Level of consciousness: awake and alert Pain management: pain level controlled Vital Signs Assessment: post-procedure vital signs reviewed and stable Respiratory status: spontaneous breathing, nonlabored ventilation, respiratory function stable and patient connected to nasal cannula oxygen Cardiovascular status: blood pressure returned to baseline and stable Postop Assessment: no signs of nausea or vomiting Anesthetic complications: no       Last Vitals:  Vitals:   10/09/16 1153 10/09/16 1200  BP: 105/73   Pulse: (!) 54 (!) 57  Resp: 13 12  Temp:      Last Pain:  Vitals:   10/09/16 1153  TempSrc:   PainSc: 5                  Merrel Crabbe,JAMES TERRILL

## 2016-10-09 NOTE — Interval H&P Note (Signed)
History and Physical Interval Note:  10/09/2016 9:51 AM  [facility]  has presented today for surgery, with the diagnosis of Appendicitis  The various methods of treatment have been discussed with the patient and family. After consideration of risks, benefits and other options for treatment, the patient has consented to  Procedure(s): APPENDECTOMY LAPAROSCOPIC (N/A) as a surgical intervention .  The patient's history has been reviewed, patient examined, no change in status, stable for surgery.  I have reviewed the patient's chart and labs.  Questions were answered to the patient's satisfaction.   The procedure has been discussed with the patient.  Alternative therapies have been discussed with the patient.  Operative risks include bleeding,  Infection,  Organ injury,  Nerve injury,  Blood vessel injury,  DVT,  Pulmonary embolism,  Death,  And possible reoperation.  Medical management risks include worsening of present situation.  The success of the procedure is 50 -90 % at treating patients symptoms.  The patient understands and agrees to proceed.  Deveron Shamoon A.

## 2016-10-09 NOTE — ED Provider Notes (Signed)
[place] [facility] DEPT Provider Note   CSN: [id number] Arrival date & time: 10/08/16  1805     History   Chief Complaint Chief Complaint  Patient presents with  . Abdominal Pain    HPI Kimberly Copeland is a 41 y.o. female.  HPI Patient presents to the emergency department with abdominal pain that started around 1 PM the patient states the pain gradually got worse as the day went on.  She also noted that she had several episodes of vomiting with nausea and diarrhea.  Patient states that the pain is mainly located on the right side of her abdomen.  The patient states she did not take any medications prior to arrival.  Nothing seems make the condition better, but palpation makes the pain worse.The patient denies chest pain, shortness of breath, headache,blurred vision, neck pain, fever, cough, weakness, numbness, dizziness, anorexia, edema,  rash, back pain, dysuria, hematemesis, bloody stool, near syncope, or syncope. Past Medical History:  Diagnosis Date  . Anemia   . History of precipitous labor and deliveries, antepartum   . Intrahepatic cholestasis of pregnancy   . Seasonal allergies     Patient Active Problem List   Diagnosis Date Noted  . Gallstones 05/16/2012  . Acute cholecystitis 05/16/2012    Past Surgical History:  Procedure Laterality Date  . CHOLECYSTECTOMY  05/17/2012   Procedure: LAPAROSCOPIC CHOLECYSTECTOMY WITH INTRAOPERATIVE CHOLANGIOGRAM;  Surgeon: Zenovia Jarred, MD;  Location: [facility];  Service: General;  Laterality: N/A;  . GYNECOLOGIC CRYOSURGERY      OB History    Gravida Para Term Preterm AB Living   5 4 4   1 4    SAB TAB Ectopic Multiple Live Births   1       4       Home Medications    Prior to Admission medications   Medication Sig Start Date End Date Taking? Authorizing Provider  famotidine (PEPCID) 20 MG tablet Take 1 tablet (20 mg total) by mouth 2 (two) times daily. Patient not taking: Reported on 10/09/2016 04/30/12   Verl Dicker,  PA-C  omeprazole (PRILOSEC) 20 MG capsule Take 1 capsule (20 mg total) by mouth daily. Patient not taking: Reported on 10/09/2016 04/30/12   Verl Dicker, PA-C  oxyCODONE-acetaminophen (ROXICET) 5-325 MG per tablet Take 0.5-2 tablets by mouth every 4 (four) hours as needed for pain. Patient not taking: Reported on 10/09/2016 05/18/12   Nat Christen, PA-C    Family History Family History  Problem Relation Age of Onset  . Peripheral vascular disease Mother   . Hypertension Mother   . Peripheral vascular disease Maternal Grandmother   . Anemia Maternal Grandmother   . Peripheral vascular disease Maternal Grandfather     Social History Social History  Substance Use Topics  . Smoking status: Never Smoker  . Smokeless tobacco: Never Used  . Alcohol use No     Allergies   Patient has no known allergies.   Review of Systems Review of Systems  All other systems negative except as documented in the HPI. All pertinent positives and negatives as reviewed in the HPI. Physical Exam Updated Vital Signs BP (!) 104/58   Pulse 67   Temp 98.7 F (37.1 C) (Oral)   Resp 18   Wt 72.6 kg   LMP 10/07/2016 (Within Days)   SpO2 96%   BMI 25.82 kg/m   Physical Exam  Constitutional: She is oriented to person, place, and time. She appears well-developed and well-nourished. No distress.  HENT:  Head: Normocephalic and atraumatic.  Mouth/Throat: Oropharynx is clear and moist.  Eyes: Pupils are equal, round, and reactive to light.  Neck: Normal range of motion. Neck supple.  Cardiovascular: Normal rate, regular rhythm and normal heart sounds.  Exam reveals no gallop and no friction rub.   No murmur heard. Pulmonary/Chest: Effort normal and breath sounds normal. No respiratory distress. She has no wheezes.  Abdominal: Soft. Bowel sounds are normal. She exhibits no distension and no mass. There is tenderness in the right lower quadrant. There is guarding.    Neurological: She is alert and  oriented to person, place, and time. She exhibits normal muscle tone. Coordination normal.  Skin: Skin is warm and dry. Capillary refill takes less than 2 seconds. No rash noted. No erythema.  Psychiatric: She has a normal mood and affect. Her behavior is normal.  Nursing note and vitals reviewed.    ED Treatments / Results  Labs (all labs ordered are listed, but only abnormal results are displayed) Labs Reviewed  COMPREHENSIVE METABOLIC PANEL - Abnormal; Notable for the following:       Result Value   Chloride 98 (*)    Glucose, Bld 109 (*)    All other components within normal limits  CBC - Abnormal; Notable for the following:    WBC 16.1 (*)    All other components within normal limits  URINALYSIS, ROUTINE W REFLEX MICROSCOPIC - Abnormal; Notable for the following:    APPearance HAZY (*)    Ketones, ur 5 (*)    All other components within normal limits  LIPASE, BLOOD  I-STAT BETA HCG BLOOD, ED ([facility], [facility], [facility] ONLY)    EKG  EKG Interpretation None       Radiology Ct Abdomen Pelvis W Contrast  Result Date: 10/09/2016 CLINICAL DATA:  41 year old female with upper abdominal pain and nausea. EXAM: CT ABDOMEN AND PELVIS WITH CONTRAST TECHNIQUE: Multidetector CT imaging of the abdomen and pelvis was performed using the standard protocol following bolus administration of intravenous contrast. CONTRAST:  1 ISOVUE-300 IOPAMIDOL (ISOVUE-300) INJECTION 61% COMPARISON:  Abdominal CT dated 07/31/2009 and ultrasound dated 05/16/2012 FINDINGS: Lower chest: A 3 mm subpleural nodular density at the right lung base appears stable since 2011. The visualized lung bases are clear. No intra-abdominal free air or free fluid. Hepatobiliary: The liver is unremarkable. No intrahepatic biliary ductal dilatation. Cholecystectomy. Pancreas: Unremarkable. No pancreatic ductal dilatation or surrounding inflammatory changes. Spleen: Normal in size without focal abnormality. Adrenals/Urinary Tract: The adrenal  glands are unremarkable. There is mild fullness of the renal collecting systems bilaterally without frank hydronephrosis. The visualized ureters and urinary bladder appear unremarkable. Stomach/Bowel: There is moderate stool within the colon. No evidence of bowel obstruction. The appendix is mildly enlarged and inflamed. There is mild stranding and inflammatory changes of the periappendiceal fat concerning for an early acute appendicitis. The appendix is located in the right hemipelvis posterior to the cecum. There is no evidence of rupture or abscess. Vascular/Lymphatic: No significant vascular findings are present. No enlarged abdominal or pelvic lymph nodes. Reproductive: The uterus demonstrates a vertical orientation. An intrauterine device is noted. The ovaries are grossly unremarkable as visualized. Other: None Musculoskeletal: No acute or significant osseous findings. IMPRESSION: 1. Findings likely represent an early acute appendicitis. Clinical correlation is recommended. No abscess or evidence of perforation. 2. No bowel obstruction. Electronically Signed   By: Anner Crete M.D.   On: 10/09/2016 00:51    Procedures Procedures (including critical care time)  Medications Ordered  in ED Medications  cefTRIAXone (ROCEPHIN) 2 g in dextrose 5 % 50 mL IVPB (not administered)    And  metroNIDAZOLE (FLAGYL) IVPB 500 mg (not administered)  sodium chloride 0.9 % bolus 1,000 mL (0 mLs Intravenous Stopped 10/09/16 0052)  fentaNYL (SUBLIMAZE) injection 50 mcg (50 mcg Intravenous Given 10/08/16 2242)  ondansetron (ZOFRAN) injection 4 mg (4 mg Intravenous Given 10/08/16 2242)  iopamidol (ISOVUE-300) 61 % injection (100 mLs  Contrast Given 10/09/16 0008)     Initial Impression / Assessment and Plan / ED Course  I have reviewed the triage vital signs and the nursing notes.  Pertinent labs & imaging results that were available during my care of the patient were reviewed by me and considered in my medical  decision making (see chart for details).     I spoke with Dr. Rosendo Gros of general surgery who will be in to evaluate the patient. did start IV antibiotics on the patient.  Advised the patient of the findings and all questions were answered.   Final Clinical Impressions(s) / ED Diagnoses   Final diagnoses:  None    New Prescriptions New Prescriptions   No medications on file     Dalia Heading, PA-C 10/09/16 0140    Ripley Fraise, MD 10/10/16 [phone]

## 2016-10-09 NOTE — ED Notes (Signed)
Pt and family updated on bed status. Pt resting in bed comfortably.

## 2016-10-09 NOTE — Progress Notes (Signed)
Patient arrived to 6n13 alert and oriented, son at bedside interpreted for his mother (the patient). IV fluids running, ambulated to bathroom, mild pain, VSS, oriented to room and staff will continue to monitor.

## 2016-10-09 NOTE — Op Note (Signed)
Appendectomy, Lap, Procedure Note  Indications: The patient presented with a history of right-sided abdominal pain. A CT and exam  revealed findings consistent with acute appendicitis.The procedure has been discussed with the patient.  Alternative therapies have been discussed with the patient.  Operative risks include bleeding,  Infection,  Organ injury,  Nerve injury,  Blood vessel injury,  DVT,  Pulmonary embolism,  Death,  And possible reoperation.  Medical management risks include worsening of present situation.  The success of the procedure is 50 -90 % at treating patients symptoms.  The patient understands and agrees to proceed.  Pre-operative Diagnosis: Acute appendicitis without mention of peritonitis  Post-operative Diagnosis: Same  Surgeon: Artesha Wemhoff A.   Assistants: OR STAFF  Anesthesia: General endotracheal anesthesia and Local anesthesia 0.25.% bupivacaine  ASA Class: 1  Procedure Details  The patient was seen again in the Holding Room. The risks, benefits, complications, treatment options, and expected outcomes were discussed with the patient and/or family. The possibilities of reaction to medication, pulmonary aspiration, perforation of viscus, bleeding, recurrent infection, finding a normal appendix, the need for additional procedures, failure to diagnose a condition, and creating a complication requiring transfusion or operation were discussed. There was concurrence with the proposed plan and informed consent was obtained. The site of surgery was properly noted/marked. The patient was taken to Operating Room, identified as [facility] and the procedure verified as Appendectomy. A Time Out was held and the above information confirmed.  The patient was placed in the supine position and general anesthesia was induced, along with placement of orogastric tube, Venodyne boots, and a Foley catheter. The abdomen was prepped and draped in a sterile fashion. A one centimeter  infraumbilical incision was made and the peritoneal cavity was accessed using the OPEN  technique. The pneumoperitoneum was then established to steady pressure of 12 mmHg. A 12 mm port was placed through the umbilical incision. Additional 5 mm cannulas then placed in the upper and lower midline  under direct vision. A careful evaluation of the entire abdomen was carried out. The patient was placed in Trendelenburg and left lateral decubitus position. The small intestines were retracted in the cephalad and left lateral direction away from the pelvis and right lower quadrant. The patient was found to have an enlarged and inflamed appendix that was extending into the pelvis. There was no evidence of perforation.  The appendix was carefully dissected. A window was made in the mesoappendix at the base of the appendix. A harmonic scalpel was used across the mesoappendix. The appendix was divided at its base using an endo-GIA stapler. Minimal appendiceal stump was left in place. There was no evidence of bleeding, leakage, or complication after division of the appendix. Irrigation was also performed and irrigate suctioned from the abdomen as well.  The umbilical port site was closed using 0 vicryl pursestring sutures fashion at the level of the fascia. The trocar site skin wounds were closed using skin staples.  Instrument, sponge, and needle counts were correct at the conclusion of the case.   Findings: The appendix was found to be inflamed. There were not signs of necrosis.  There was not perforation. There was not abscess formation.  Estimated Blood Loss:  less than 50 mL         Drains: NONE         Total IV Fluids: PER RECORD         Specimens: appendix         Complications:  None;  patient tolerated the procedure well.         Disposition: PACU - hemodynamically stable.         Condition: stable

## 2016-10-09 NOTE — H&P (Signed)
Kimberly Copeland is an 41 y.o. female.   Chief Complaint: abdominal pain HPI: 41 y/o F with a <24hr h/o abdominal paint that she states started in the epigastrum.  Pt with assoc n/v, but denies diarrhea.  Pt states the pain does not radiate.  There are no relieving factors.    Pt underwent CT in ED which was c/w early acute appendicitis. Pt with an elev WBC.  Past Medical History:  Diagnosis Date  . Anemia   . History of precipitous labor and deliveries, antepartum   . Intrahepatic cholestasis of pregnancy   . Seasonal allergies     Past Surgical History:  Procedure Laterality Date  . CHOLECYSTECTOMY  05/17/2012   Procedure: LAPAROSCOPIC CHOLECYSTECTOMY WITH INTRAOPERATIVE CHOLANGIOGRAM;  Surgeon: Kimberly Jarred, MD;  Location: [place];  Service: General;  Laterality: N/A;  . GYNECOLOGIC CRYOSURGERY      Family History  Problem Relation Age of Onset  . Peripheral vascular disease Mother   . Hypertension Mother   . Peripheral vascular disease Maternal Grandmother   . Anemia Maternal Grandmother   . Peripheral vascular disease Maternal Grandfather    Social History:  reports that she has never smoked. She has never used smokeless tobacco. She reports that she does not drink alcohol or use drugs.  Allergies: No Known Allergies   (Not in a hospital admission)  Results for orders placed or performed during the hospital encounter of 10/08/16 (from the past 48 hour(s))  Lipase, blood     Status: None   Collection Time: 10/08/16  6:23 PM  Result Value Ref Range   Lipase 21 11 - 51 U/L  Comprehensive metabolic panel     Status: Abnormal   Collection Time: 10/08/16  6:23 PM  Result Value Ref Range   Sodium 136 135 - 145 mmol/L   Potassium 3.9 3.5 - 5.1 mmol/L   Chloride 98 (L) 101 - 111 mmol/L   CO2 25 22 - 32 mmol/L   Glucose, Bld 109 (H) 65 - 99 mg/dL   BUN 11 6 - 20 mg/dL   Creatinine, Ser 0.63 0.44 - 1.00 mg/dL   Calcium 9.3 8.9 - 10.3 mg/dL   Total Protein 7.4 6.5 -  8.1 g/dL   Albumin 4.4 3.5 - 5.0 g/dL   AST 23 15 - 41 U/L   ALT 21 14 - 54 U/L   Alkaline Phosphatase 58 38 - 126 U/L   Total Bilirubin 0.9 0.3 - 1.2 mg/dL   GFR calc non Af Amer >60 >60 mL/min   GFR calc Af Amer >60 >60 mL/min    Comment: (NOTE) The eGFR has been calculated using the CKD EPI equation. This calculation has not been validated in all clinical situations. eGFR's persistently <60 mL/min signify possible Chronic Kidney Disease.    Anion gap 13 5 - 15  CBC     Status: Abnormal   Collection Time: 10/08/16  6:23 PM  Result Value Ref Range   WBC 16.1 (H) 4.0 - 10.5 K/uL   RBC 4.67 3.87 - 5.11 MIL/uL   Hemoglobin 12.7 12.0 - 15.0 g/dL   HCT 39.0 36.0 - 46.0 %   MCV 83.5 78.0 - 100.0 fL   MCH 27.2 26.0 - 34.0 pg   MCHC 32.6 30.0 - 36.0 g/dL   RDW 13.2 11.5 - 15.5 %   Platelets 277 150 - 400 K/uL  I-Stat beta hCG blood, ED     Status: None   Collection Time: 10/08/16  6:30  PM  Result Value Ref Range   I-stat hCG, quantitative <5.0 <5 mIU/mL   Comment 3            Comment:   GEST. AGE      CONC.  (mIU/mL)   <=1 WEEK        5 - 50     2 WEEKS       50 - 500     3 WEEKS       100 - 10,000     4 WEEKS     1,000 - 30,000        FEMALE AND NON-PREGNANT FEMALE:     LESS THAN 5 mIU/mL   Urinalysis, Routine w reflex microscopic     Status: Abnormal   Collection Time: 10/08/16  8:25 PM  Result Value Ref Range   Color, Urine YELLOW YELLOW   APPearance HAZY (A) CLEAR   Specific Gravity, Urine 1.026 1.005 - 1.030   pH 6.0 5.0 - 8.0   Glucose, UA NEGATIVE NEGATIVE mg/dL   Hgb urine dipstick NEGATIVE NEGATIVE   Bilirubin Urine NEGATIVE NEGATIVE   Ketones, ur 5 (A) NEGATIVE mg/dL   Protein, ur NEGATIVE NEGATIVE mg/dL   Nitrite NEGATIVE NEGATIVE   Leukocytes, UA NEGATIVE NEGATIVE   Ct Abdomen Pelvis W Contrast  Result Date: 10/09/2016 CLINICAL DATA:  41 year old female with upper abdominal pain and nausea. EXAM: CT ABDOMEN AND PELVIS WITH CONTRAST TECHNIQUE: Multidetector  CT imaging of the abdomen and pelvis was performed using the standard protocol following bolus administration of intravenous contrast. CONTRAST:  1 ISOVUE-300 IOPAMIDOL (ISOVUE-300) INJECTION 61% COMPARISON:  Abdominal CT dated 07/31/2009 and ultrasound dated 05/16/2012 FINDINGS: Lower chest: A 3 mm subpleural nodular density at the right lung base appears stable since 2011. The visualized lung bases are clear. No intra-abdominal free air or free fluid. Hepatobiliary: The liver is unremarkable. No intrahepatic biliary ductal dilatation. Cholecystectomy. Pancreas: Unremarkable. No pancreatic ductal dilatation or surrounding inflammatory changes. Spleen: Normal in size without focal abnormality. Adrenals/Urinary Tract: The adrenal glands are unremarkable. There is mild fullness of the renal collecting systems bilaterally without frank hydronephrosis. The visualized ureters and urinary bladder appear unremarkable. Stomach/Bowel: There is moderate stool within the colon. No evidence of bowel obstruction. The appendix is mildly enlarged and inflamed. There is mild stranding and inflammatory changes of the periappendiceal fat concerning for an early acute appendicitis. The appendix is located in the right hemipelvis posterior to the cecum. There is no evidence of rupture or abscess. Vascular/Lymphatic: No significant vascular findings are present. No enlarged abdominal or pelvic lymph nodes. Reproductive: The uterus demonstrates a vertical orientation. An intrauterine device is noted. The ovaries are grossly unremarkable as visualized. Other: None Musculoskeletal: No acute or significant osseous findings. IMPRESSION: 1. Findings likely represent an early acute appendicitis. Clinical correlation is recommended. No abscess or evidence of perforation. 2. No bowel obstruction. Electronically Signed   By: Anner Crete M.D.   On: 10/09/2016 00:51    Review of Systems  Constitutional: Negative for chills, fever,  malaise/fatigue and weight loss.  HENT: Negative for ear discharge, ear pain, hearing loss and tinnitus.   Eyes: Negative for blurred vision, double vision and photophobia.  Respiratory: Negative for cough, hemoptysis, sputum production and shortness of breath.   Cardiovascular: Negative for chest pain, palpitations, orthopnea and claudication.  Gastrointestinal: Positive for abdominal pain, nausea and vomiting. Negative for diarrhea and heartburn.  Genitourinary: Negative for dysuria, flank pain, hematuria and urgency.  Musculoskeletal: Negative  for back pain and neck pain.  All other systems reviewed and are negative.   Blood pressure 94/60, pulse (!) 57, temperature 97.9 F (36.6 C), temperature source Oral, resp. rate 16, weight 72.6 kg (160 lb), last menstrual period 10/07/2016, SpO2 100 %. Physical Exam  Constitutional: She is oriented to person, place, and time. She appears well-developed and well-nourished.  HENT:  Head: Normocephalic and atraumatic.  Right Ear: External ear normal.  Left Ear: External ear normal.  Mouth/Throat: Oropharynx is clear and moist.  Eyes: Conjunctivae and EOM are normal. Pupils are equal, round, and reactive to light. Right eye exhibits no discharge. Left eye exhibits no discharge. No scleral icterus.  Neck: Normal range of motion. Neck supple. No JVD present. No tracheal deviation present. No thyromegaly present.  Cardiovascular: Normal rate, regular rhythm, normal heart sounds and intact distal pulses.  Exam reveals no gallop and no friction rub.   No murmur heard. Respiratory: Effort normal and breath sounds normal. No stridor. No respiratory distress. She has no wheezes. She has no rales. She exhibits no tenderness.  GI: Soft. Bowel sounds are normal. She exhibits no distension and no mass. There is tenderness. There is tenderness at McBurney's point. There is no rebound and no guarding.  Musculoskeletal: Normal range of motion. She exhibits no edema,  tenderness or deformity.  Lymphadenopathy:    She has no cervical adenopathy.  Neurological: She is alert and oriented to person, place, and time.  Skin: Skin is warm and dry. No rash noted. No erythema. No pallor.     Assessment/Plan 41 y/o F with acute appendicitis. 1. Admit to floor 2. con't IV abx 3. Plan for OR in AM with Dr. Brantley Stage 4. I discussed with the patient the risks benefits of the procedure to include but not limited to: Infection, bleeding, damage to surrounding structures, possible ileus, possible postoperative infection. Patient voiced understanding and wishes to proceed.   Reyes Ivan, MD 10/09/2016, 3:00 AM

## 2016-10-09 NOTE — Anesthesia Procedure Notes (Signed)
Procedure Name: Intubation Date/Time: 10/09/2016 10:10 AM Performed by: Mervyn Gay Pre-anesthesia Checklist: Patient identified, Patient being monitored, Timeout performed, Emergency Drugs available and Suction available Patient Re-evaluated:Patient Re-evaluated prior to inductionOxygen Delivery Method: Circle System Utilized Preoxygenation: Pre-oxygenation with 100% oxygen Intubation Type: IV induction Ventilation: Mask ventilation without difficulty Laryngoscope Size: Miller and 3 Grade View: Grade I Tube type: Oral Tube size: 7.5 mm Number of attempts: 1 Airway Equipment and Method: Stylet Placement Confirmation: ETT inserted through vocal cords under direct vision,  positive ETCO2 and breath sounds checked- equal and bilateral Secured at: 21 cm Tube secured with: Tape Dental Injury: Teeth and Oropharynx as per pre-operative assessment

## 2016-10-09 NOTE — Discharge Instructions (Signed)
please arrive at least 30 min before your appointment to complete your check in paperwork.  If you are unable to arrive 30 min prior to your appointment time we may have to cancel or reschedule you. ° °LAPAROSCOPIC SURGERY: POST OP INSTRUCTIONS  °1. DIET: Follow a light bland diet the first 24 hours after arrival home, such as soup, liquids, crackers, etc. Be sure to include lots of fluids daily. Avoid fast food or heavy meals as your are more likely to get nauseated. Eat a low fat the next few days after surgery.  °2. Take your usually prescribed home medications unless otherwise directed. °3. PAIN CONTROL:  °1. Pain is best controlled by a usual combination of three different methods TOGETHER:  °1. Ice/Heat °2. Over the counter pain medication °3. Prescription pain medication °2. Most patients will experience some swelling and bruising around the incisions. Ice packs or heating pads (30-60 minutes up to 6 times a day) will help. Use ice for the first few days to help decrease swelling and bruising, then switch to heat to help relax tight/sore spots and speed recovery. Some people prefer to use ice alone, heat alone, alternating between ice & heat. Experiment to what works for you. Swelling and bruising can take several weeks to resolve.  °3. It is helpful to take an over-the-counter pain medication regularly for the first few weeks. Choose one of the following that works best for you:  °1. Naproxen (Aleve, etc) Two 220mg tabs twice a day °2. Ibuprofen (Advil, etc) Three 200mg tabs four times a day (every meal & bedtime) °3. Acetaminophen (Tylenol, etc) 500-650mg four times a day (every meal & bedtime) °4. A prescription for pain medication (such as oxycodone, hydrocodone, etc) should be given to you upon discharge. Take your pain medication as prescribed.  °1. If you are having problems/concerns with the prescription medicine (does not control pain, nausea, vomiting, rash, itching, etc), please call us [phone] to see if we need to switch you to a different pain medicine that will work better for you and/or control your side effect better. °2. If you need a refill on your pain medication, please contact your pharmacy. They will contact our office to request authorization. Prescriptions will not be filled after 5 pm or on week-ends. °4. Avoid getting constipated. Between the surgery and the pain medications, it is common to experience some constipation. Increasing fluid intake and taking a fiber supplement (such as Metamucil, Citrucel, FiberCon, MiraLax, etc) 1-2 times a day regularly will usually help prevent this problem from occurring. A mild laxative (prune juice, Milk of Magnesia, MiraLax, etc) should be taken according to package directions if there are no bowel movements after 48 hours.  °5. Watch out for diarrhea. If you have many loose bowel movements, simplify your diet to bland foods & liquids for a few days. Stop any stool softeners and decrease your fiber supplement. Switching to mild anti-diarrheal medications (Kayopectate, Pepto Bismol) can help. If this worsens or does not improve, please call us. °6. Wash / shower every day. You may shower over the dressings as they are waterproof. Continue to shower over incision(s) after the dressing is off. °7. Remove your waterproof bandages 5 days after surgery. You may leave the incision open to air. You may replace a dressing/Band-Aid to cover the incision for comfort if you wish.  °8. ACTIVITIES as tolerated:  °1. You may resume regular (light) daily activities beginning the next day--such as daily self-care, walking, climbing stairs--gradually   increasing activities as tolerated. If you can walk 30 minutes without difficulty, it is safe to try more intense activity such as jogging, treadmill, bicycling, low-impact aerobics, swimming, etc. °2. Save the most intensive and strenuous activity for last such as sit-ups, heavy lifting, contact sports, etc Refrain  from any heavy lifting or straining until you are off narcotics for pain control.  °3. DO NOT PUSH THROUGH PAIN. Let pain be your guide: If it hurts to do something, don't do it. Pain is your body warning you to avoid that activity for another week until the pain goes down. °4. You may drive when you are no longer taking prescription pain medication, you can comfortably wear a seatbelt, and you can safely maneuver your car and apply brakes. °5. You may have sexual intercourse when it is comfortable.  °9. FOLLOW UP in our office  °1. Please call [facility] at [phone] to set up an appointment to see your surgeon in the office for a follow-up appointment approximately 2-3 weeks after your surgery. °2. Make sure that you call for this appointment the day you arrive home to insure a convenient appointment time. °     10. IF YOU HAVE DISABILITY OR FAMILY LEAVE FORMS, BRING THEM TO THE               OFFICE FOR PROCESSING.  ° °WHEN TO CALL US [phone]:  °1. Poor pain control °2. Reactions / problems with new medications (rash/itching, nausea, etc)  °3. Fever over 101.5 F (38.5 C) °4. Inability to urinate °5. Nausea and/or vomiting °6. Worsening swelling or bruising °7. Continued bleeding from incision. °8. Increased pain, redness, or drainage from the incision ° °The clinic staff is available to answer your questions during regular business hours (8:30am-5pm). Please don’t hesitate to call and ask to speak to one of our nurses for clinical concerns.  °If you have a medical emergency, go to the nearest emergency room or call 911.  °A surgeon from [facility] is always on call at the hospitals  ° °[facility]  °[address], [address], [place], [place] [address] ?  °MAIN: [phone] ? TOLL FREE: [phone] ?  °FAX [phone]  °[email or web address] ° °

## 2016-10-09 NOTE — Anesthesia Preprocedure Evaluation (Signed)
Anesthesia Evaluation  Patient identified by MRN, date of birth, ID band Patient awake    Reviewed: Allergy & Precautions, NPO status , Patient's Chart, lab work & pertinent test results  Airway Mallampati: II   Neck ROM: Full    Dental no notable dental hx.    Pulmonary neg pulmonary ROS,    breath sounds clear to auscultation       Cardiovascular negative cardio ROS   Rhythm:Regular Rate:Normal     Neuro/Psych negative neurological ROS  negative psych ROS   GI/Hepatic Neg liver ROS, Acute appendicitis   Endo/Other  negative endocrine ROS  Renal/GU negative Renal ROS  negative genitourinary   Musculoskeletal negative musculoskeletal ROS (+)   Abdominal   Peds negative pediatric ROS (+)  Hematology negative hematology ROS (+)   Anesthesia Other Findings   Reproductive/Obstetrics negative OB ROS                             Anesthesia Physical Anesthesia Plan  ASA: I  Anesthesia Plan: General   Post-op Pain Management:    Induction: Intravenous  Airway Management Planned: Oral ETT  Additional Equipment:   Intra-op Plan:   Post-operative Plan: Extubation in OR  Informed Consent:   Dental advisory given  Plan Discussed with: CRNA  Anesthesia Plan Comments:         Anesthesia Quick Evaluation

## 2016-10-09 NOTE — Transfer of Care (Signed)
Immediate Anesthesia Transfer of Care Note  Patient: Kimberly Copeland [facility]  Procedure(s) Performed: Procedure(s): APPENDECTOMY LAPAROSCOPIC (N/A)  Patient Location: PACU  Anesthesia Type:General  Level of Consciousness: drowsy and patient cooperative  Airway & Oxygen Therapy: Patient Spontanous Breathing and Patient connected to face mask oxygen  Post-op Assessment: Report given to RN, Post -op Vital signs reviewed and stable and Patient moving all extremities X 4  Post vital signs: Reviewed and stable  Last Vitals:  Vitals:   10/09/16 0800 10/09/16 1123  BP: 98/65 106/62  Pulse: (!) 51 67  Resp:  16  Temp:      Last Pain:  Vitals:   10/09/16 0737  TempSrc: Oral  PainSc:          Complications: No apparent anesthesia complications

## 2016-10-09 NOTE — Progress Notes (Signed)
Patient arrived back to unit from PACU, received telephone report from PACU nurse. Patient alert and oriented, VSS, 3 lap sites with dermabond noted to mid abdomen, IV fluids running. Patient reports mild pain, family at bedside, will continue to monitor.

## 2016-10-09 NOTE — Progress Notes (Signed)
Patient transported to OR, report called to short stay, notified them she just arrived to the floor so the pre-op checklist has not been completed. Family went with patient.

## 2016-10-10 ENCOUNTER — Encounter (HOSPITAL_COMMUNITY): Payer: Self-pay | Admitting: Surgery

## 2016-10-10 MED ORDER — ONDANSETRON 4 MG PO TBDP: 4.0000 mg | ORAL_TABLET | Freq: Three times a day (TID) | ORAL | 0 refills | Status: DC | PRN

## 2016-10-10 MED ORDER — OXYCODONE HCL 5 MG PO TABS: 5.0000 mg | ORAL_TABLET | ORAL | 0 refills | Status: DC | PRN

## 2016-10-10 NOTE — Care Management Note (Signed)
Case Management Note  Patient Details  Name: Kimberly Copeland MRN: [id number] Date of Birth: 12/28/1975  Subjective/Objective:   Laparoscopic Appendectomy                 Action/Plan: Discharge Planning: Chart reveiwed. No NCM needs identified. Has follow up appointment with Surgeon on 10/30/2016 at 11:45 am.    Expected Discharge Date:  10/10/16               Expected Discharge Plan:  Home/Self Care  In-House Referral:  NA  Discharge planning Services  CM Consult  Post Acute Care Choice:  NA Choice offered to:  NA  DME Arranged:  N/A DME Agency:  NA  HH Arranged:  NA HH Agency:  NA  Status of Service:  Completed, signed off  If discussed at [place] of Stay Meetings, dates discussed:    Additional Comments:  Erenest Rasher, RN 10/10/2016, 9:53 AM

## 2016-10-10 NOTE — Discharge Summary (Signed)
[facility] Discharge Summary   Patient ID: Kimberly Copeland MRN: [id number] DOB/AGE: 1976/05/09 41 y.o.  Admit date: 10/08/2016 Discharge date: 10/10/2016  Discharge Diagnosis Patient Active Problem List   Diagnosis Date Noted  . Acute appendicitis 10/09/2016    Imaging: Ct Abdomen Pelvis W Contrast  Result Date: 10/09/2016 CLINICAL DATA:  41 year old female with upper abdominal pain and nausea. EXAM: CT ABDOMEN AND PELVIS WITH CONTRAST TECHNIQUE: Multidetector CT imaging of the abdomen and pelvis was performed using the standard protocol following bolus administration of intravenous contrast. CONTRAST:  1 ISOVUE-300 IOPAMIDOL (ISOVUE-300) INJECTION 61% COMPARISON:  Abdominal CT dated 07/31/2009 and ultrasound dated 05/16/2012 FINDINGS: Lower chest: A 3 mm subpleural nodular density at the right lung base appears stable since 2011. The visualized lung bases are clear. No intra-abdominal free air or free fluid. Hepatobiliary: The liver is unremarkable. No intrahepatic biliary ductal dilatation. Cholecystectomy. Pancreas: Unremarkable. No pancreatic ductal dilatation or surrounding inflammatory changes. Spleen: Normal in size without focal abnormality. Adrenals/Urinary Tract: The adrenal glands are unremarkable. There is mild fullness of the renal collecting systems bilaterally without frank hydronephrosis. The visualized ureters and urinary bladder appear unremarkable. Stomach/Bowel: There is moderate stool within the colon. No evidence of bowel obstruction. The appendix is mildly enlarged and inflamed. There is mild stranding and inflammatory changes of the periappendiceal fat concerning for an early acute appendicitis. The appendix is located in the right hemipelvis posterior to the cecum. There is no evidence of rupture or abscess. Vascular/Lymphatic: No significant vascular findings are present. No enlarged abdominal or pelvic lymph nodes. Reproductive: The uterus demonstrates a  vertical orientation. An intrauterine device is noted. The ovaries are grossly unremarkable as visualized. Other: None Musculoskeletal: No acute or significant osseous findings. IMPRESSION: 1. Findings likely represent an early acute appendicitis. Clinical correlation is recommended. No abscess or evidence of perforation. 2. No bowel obstruction. Electronically Signed   By: Kimberly Copeland M.D.   On: 10/09/2016 00:51    Procedures Dr. Marcello Moores Copeland (10/09/16)- Laparoscopic Appendectomy  Hospital Course:  [name] presented to [facility] ED with less than 24 hours of abdominal pain associated with nausea/vomiting. Workup significant for leukocytosis and above CT consistent with acute appendicitis. Patient was started on IV abx and admitted to the hospital. She underwent the operation above and tolerated the procedure well. On post-operative day #1 the patients vitals were stable, pain was controlled, tolerating PO, mobilizing, and clinically stable for discharge home. She will follow up in our office as below.  I have personally reviewed the patients medication history on the [place] controlled substance database.  Physical Exam: General:  Alert, NAD, pleasant, comfortable Abd:  Soft, ND, mild tenderness, incisions C/D/I  Allergies as of 10/10/2016   No Known Allergies     Medication List    STOP taking these medications   oxyCODONE-acetaminophen 5-325 MG tablet Commonly known as:  ROXICET     TAKE these medications   famotidine 20 MG tablet Commonly known as:  PEPCID Take 1 tablet (20 mg total) by mouth 2 (two) times daily.   omeprazole 20 MG capsule Commonly known as:  PRILOSEC Take 1 capsule (20 mg total) by mouth daily.   oxyCODONE 5 MG immediate release tablet Commonly known as:  Oxy IR/ROXICODONE Take 1-2 tablets (5-10 mg total) by mouth every 4 (four) hours as needed for moderate pain.       Follow-up [facility], [place]. Go on 10/30/2016.    Specialty:  General Surgery  Why:  at 11:45 AM for post-operative follow up. please arrive 30 minutes early to fill out any necessary paperwork. Contact information: [address] [place] [phone]          Signed: Obie Copeland, [facility] Surgery 10/10/2016, 9:49 AM Pager: [phone] Consults: [phone] Mon-Fri 7:00 am-4:30 pm Sat-Sun 7:00 am-11:30 am

## 2016-10-10 NOTE — Progress Notes (Signed)
SWAT nurse for floor. Completed patient admission history.

## 2016-10-17 ENCOUNTER — Emergency Department (HOSPITAL_COMMUNITY)
Admission: EM | Admit: 2016-10-17 | Discharge: 2016-10-18 | Disposition: A | Discharge status: Home / Self Care | Payer: Self-pay | Attending: Emergency Medicine | Admitting: Emergency Medicine

## 2016-10-17 ENCOUNTER — Emergency Department (HOSPITAL_COMMUNITY): Payer: Self-pay

## 2016-10-17 ENCOUNTER — Encounter (HOSPITAL_COMMUNITY): Payer: Self-pay | Admitting: *Deleted

## 2016-10-17 DIAGNOSIS — R7989 Other specified abnormal findings of blood chemistry: Secondary | ICD-10-CM

## 2016-10-17 DIAGNOSIS — R945 Abnormal results of liver function studies: Secondary | ICD-10-CM | POA: Insufficient documentation

## 2016-10-17 DIAGNOSIS — J020 Streptococcal pharyngitis: Secondary | ICD-10-CM | POA: Insufficient documentation

## 2016-10-17 LAB — CBC WITH DIFFERENTIAL/PLATELET
BASOS PCT: 0 %
Basophils Absolute: 0 10*3/uL (ref 0.0–0.1)
EOS ABS: 0.3 10*3/uL (ref 0.0–0.7)
EOS PCT: 2 %
HCT: 41 % (ref 36.0–46.0)
Hemoglobin: 13 g/dL (ref 12.0–15.0)
Lymphocytes Relative: 11 %
Lymphs Abs: 1.5 10*3/uL (ref 0.7–4.0)
MCH: 26.7 pg (ref 26.0–34.0)
MCHC: 31.7 g/dL (ref 30.0–36.0)
MCV: 84.2 fL (ref 78.0–100.0)
MONO ABS: 0.6 10*3/uL (ref 0.1–1.0)
Monocytes Relative: 4 %
Neutro Abs: 11.6 10*3/uL — ABNORMAL HIGH (ref 1.7–7.7)
Neutrophils Relative %: 83 %
Platelets: 245 10*3/uL (ref 150–400)
RBC: 4.87 MIL/uL (ref 3.87–5.11)
RDW: 13.3 % (ref 11.5–15.5)
WBC: 14.1 10*3/uL — ABNORMAL HIGH (ref 4.0–10.5)

## 2016-10-17 LAB — URINALYSIS, ROUTINE W REFLEX MICROSCOPIC
BILIRUBIN URINE: NEGATIVE
Glucose, UA: NEGATIVE mg/dL
Hgb urine dipstick: NEGATIVE
KETONES UR: NEGATIVE mg/dL
Leukocytes, UA: NEGATIVE
NITRITE: NEGATIVE
PH: 8 (ref 5.0–8.0)
Protein, ur: NEGATIVE mg/dL
Specific Gravity, Urine: 1.009 (ref 1.005–1.030)

## 2016-10-17 LAB — COMPREHENSIVE METABOLIC PANEL
ALBUMIN: 3.8 g/dL (ref 3.5–5.0)
ALK PHOS: 137 U/L — AB (ref 38–126)
ALT: 156 U/L — ABNORMAL HIGH (ref 14–54)
AST: 60 U/L — AB (ref 15–41)
Anion gap: 9 (ref 5–15)
BILIRUBIN TOTAL: 0.6 mg/dL (ref 0.3–1.2)
BUN: 9 mg/dL (ref 6–20)
CALCIUM: 9.1 mg/dL (ref 8.9–10.3)
CO2: 26 mmol/L (ref 22–32)
Chloride: 100 mmol/L — ABNORMAL LOW (ref 101–111)
Creatinine, Ser: 0.73 mg/dL (ref 0.44–1.00)
GFR calc Af Amer: >60 mL/min (ref >60)
GFR calc non Af Amer: >60 mL/min (ref >60)
GLUCOSE: 75 mg/dL (ref 65–99)
Potassium: 4.3 mmol/L (ref 3.5–5.1)
Sodium: 135 mmol/L (ref 135–145)
TOTAL PROTEIN: 7.3 g/dL (ref 6.5–8.1)

## 2016-10-17 LAB — I-STAT CG4 LACTIC ACID, ED
LACTIC ACID, VENOUS: 1.07 mmol/L (ref 0.5–1.9)
Lactic Acid, Venous: 1.34 mmol/L (ref 0.5–1.9)

## 2016-10-17 LAB — I-STAT BETA HCG BLOOD, ED (MC, WL, AP ONLY): I-stat hCG, quantitative: <5 m[IU]/mL (ref <5)

## 2016-10-17 LAB — RAPID STREP SCREEN (MED CTR MEBANE ONLY): Streptococcus, Group A Screen (Direct): POSITIVE — AB

## 2016-10-17 LAB — D-DIMER, QUANTITATIVE (NOT AT ARMC): D DIMER QUANT: 0.55 ug{FEU}/mL — AB (ref 0.00–0.50)

## 2016-10-17 MED ORDER — SODIUM CHLORIDE 0.9 % IV BOLUS (SEPSIS)
1000.0000 mL | Freq: Once | INTRAVENOUS | Status: AC
Start: 2016-10-17 — End: 2016-10-18
  Administered 2016-10-17: 1000 mL via INTRAVENOUS

## 2016-10-17 MED ORDER — IBUPROFEN 400 MG PO TABS
600.0000 mg | ORAL_TABLET | Freq: Once | ORAL | Status: AC
  Administered 2016-10-18: 600 mg via ORAL
  Filled 2016-10-17: qty 1

## 2016-10-17 MED ORDER — PENICILLIN G BENZATHINE & PROC 1200000 UNIT/2ML IM SUSP
1.2000 10*6.[IU] | Freq: Once | INTRAMUSCULAR | Status: AC
  Administered 2016-10-18: 1.2 10*6.[IU] via INTRAMUSCULAR
  Filled 2016-10-17: qty 2

## 2016-10-17 MED ORDER — ACETAMINOPHEN 325 MG PO TABS
650.0000 mg | ORAL_TABLET | Freq: Once | ORAL | Status: AC | PRN
  Administered 2016-10-17: 650 mg via ORAL

## 2016-10-17 MED ORDER — ACETAMINOPHEN 325 MG PO TABS
ORAL_TABLET | ORAL | Status: AC
  Filled 2016-10-17: qty 2

## 2016-10-17 NOTE — Discharge Instructions (Signed)
Please use warm salt water rinses for throat. Take only as needed or throat relief. Drink at least 8 glasses of water throughout the day. Please follow-up with her primary care provider tomorrow regarding today's visit.  Get help right away if: You have difficulty breathing. You cannot swallow fluids, soft foods, or your saliva. You have increased swelling in your throat or neck. You have persistent nausea and vomiting.

## 2016-10-17 NOTE — ED Provider Notes (Signed)
[facility] DEPT Provider Note   CSN: [id number] Arrival date & time: 10/17/16  1726     History   Chief Complaint Chief Complaint  Patient presents with  . Post-op Problem  . Fever    HPI Kimberly Copeland is a 41 y.o. female recently had appendectomy on 4/23 presents today with Fevers and chills started today. She reports associated headache, swelling to legs, and shortness of breath whenever she feels nauseas. She also reports back pain since she was discharged after surgery that has been unchanged. She reports trying ibuprofen post surgery with moderate relief. She reports nothing makes her symptoms better/worse. She denies urinary symptoms, chest pain, pleuritic chest pain, vomiting, changes in bowel movements, blood in stool, visual symptoms, changes in gait. She reports walking everyday since surgery. She reports having her gallbladder removed 4 years ago. She denies hx of DVT or PE.  She also complains of sore throat that started today. She denies trouble breathing, trouble swallowing, or trouble handling secretions.   The history is provided by the patient. No language interpreter was used.  Fever   Associated symptoms include headaches. Pertinent negatives include no chest pain and no vomiting.    Past Medical History:  Diagnosis Date  . Anemia   . History of precipitous labor and deliveries, antepartum   . Intrahepatic cholestasis of pregnancy   . Seasonal allergies     Patient Active Problem List   Diagnosis Date Noted  . Acute appendicitis 10/09/2016  . Gallstones 05/16/2012  . Acute cholecystitis 05/16/2012    Past Surgical History:  Procedure Laterality Date  . CHOLECYSTECTOMY  05/17/2012   Procedure: LAPAROSCOPIC CHOLECYSTECTOMY WITH INTRAOPERATIVE CHOLANGIOGRAM;  Surgeon: Zenovia Jarred, MD;  Location: [facility];  Service: General;  Laterality: N/A;  . GYNECOLOGIC CRYOSURGERY    . LAPAROSCOPIC APPENDECTOMY N/A 10/09/2016   Procedure: APPENDECTOMY  LAPAROSCOPIC;  Surgeon: Erroll Luna, MD;  Location: [facility];  Service: General;  Laterality: N/A;    OB History    Gravida Para Term Preterm AB Living   5 4 4   1 4    SAB TAB Ectopic Multiple Live Births   1       4       Home Medications    Prior to Admission medications   Medication Sig Start Date End Date Taking? Authorizing Provider  ondansetron (ZOFRAN-ODT) 4 MG disintegrating tablet Take 1 tablet (4 mg total) by mouth every 8 (eight) hours as needed for nausea. 10/10/16  Yes Darci Current Simaan, PA-C  oxyCODONE (OXY IR/ROXICODONE) 5 MG immediate release tablet Take 1-2 tablets (5-10 mg total) by mouth every 4 (four) hours as needed for moderate pain. 10/10/16  Yes Elizabeth S Simaan, PA-C  famotidine (PEPCID) 20 MG tablet Take 1 tablet (20 mg total) by mouth 2 (two) times daily. Patient not taking: Reported on 10/09/2016 04/30/12   Verl Dicker, PA-C    Family History Family History  Problem Relation Age of Onset  . Peripheral vascular disease Mother   . Hypertension Mother   . Peripheral vascular disease Maternal Grandmother   . Anemia Maternal Grandmother   . Peripheral vascular disease Maternal Grandfather     Social History Social History  Substance Use Topics  . Smoking status: Never Smoker  . Smokeless tobacco: Never Used  . Alcohol use No     Allergies   Patient has no known allergies.   Review of Systems Review of Systems  Constitutional: Positive for chills and fever.  Eyes: Negative  for photophobia and visual disturbance.  Respiratory: Positive for shortness of breath.   Cardiovascular: Negative for chest pain.  Gastrointestinal: Positive for abdominal pain (At incision sites) and nausea. Negative for vomiting.  Genitourinary: Negative for difficulty urinating, dysuria, frequency and urgency.  Musculoskeletal: Negative for neck pain and neck stiffness.  Neurological: Positive for headaches.     Physical Exam Updated Vital Signs BP 116/81    Pulse (!) 102   Temp 99.8 F (37.7 C) (Oral)   Resp (!) 24   LMP 10/07/2016 (Within Days)   SpO2 100%   Physical Exam  Constitutional: She is oriented to person, place, and time. She appears well-developed and well-nourished.  Well appearing. Airway patent. No trouble swallowing, no trouble breathing. No trismus, no drooling. Handling secretions well.  HENT:  Head: Normocephalic and atraumatic.  Nose: Nose normal.  Mouth/Throat: Oropharynx is clear and moist.  Left tonsil with mild erythema. Left tonsill slightly swollen. No evidence of exudates.   Eyes: Conjunctivae and EOM are normal. Pupils are equal, round, and reactive to light.  Neck: Normal range of motion.  Cardiovascular: Normal rate, normal heart sounds and intact distal pulses.   No murmur heard. Pulmonary/Chest: Effort normal and breath sounds normal. No respiratory distress. She has no wheezes. She has no rales.  Normal work of breathing. No respiratory distress noted.   Abdominal: Soft. There is no tenderness. There is no rebound and no guarding.  Soft and nontender. Negative murphy's sign. No focal tenderness at Mcburney's point. No CVA tenderness.   Musculoskeletal: Normal range of motion.  Bilateral hips, knees, and ankles with normal free range of motion. Negative Homans sign bilaterally. No calf tenderness.   Lymphadenopathy:    She has cervical adenopathy.  Neurological: She is alert and oriented to person, place, and time.  Cranial Nerves:  III,IV, VI: ptosis not present, extra-ocular movements intact bilaterally, direct and consensual pupillary light reflexes intact bilaterally V: facial sensation, jaw opening, and bite strength equal bilaterally VII: eyebrow raise, eyelid close, smile, frown, pucker equal bilaterally VIII: hearing grossly normal bilaterally  IX,X: palate elevation and swallowing intact XI: bilateral shoulder shrug and lateral head rotation equal and strong XII: midline tongue  extension  Negative pronator drift, negative Romberg, negative RAM's, negative heel-to-shin, negative finger to nose.    Sensory intact.  Muscle strength 5/5 Patient able to ambulate without difficulty.   Skin: Skin is warm. Capillary refill takes less than 2 seconds.  3 Surgical incision sites. No surrounding erythema. No discharge noted. Does not appear infectious. Appears to be healing well. No leg swelling, discoloration, tenderness palpation bilaterally.   Psychiatric: She has a normal mood and affect. Her behavior is normal.  Nursing note and vitals reviewed.    ED Treatments / Results  Labs (all labs ordered are listed, but only abnormal results are displayed) Labs Reviewed  RAPID STREP SCREEN (NOT AT [facility]) - Abnormal; Notable for the following:       Result Value   Streptococcus, Group A Screen (Direct) POSITIVE (*)    All other components within normal limits  COMPREHENSIVE METABOLIC PANEL - Abnormal; Notable for the following:    Chloride 100 (*)    AST 60 (*)    ALT 156 (*)    Alkaline Phosphatase 137 (*)    All other components within normal limits  CBC WITH DIFFERENTIAL/PLATELET - Abnormal; Notable for the following:    WBC 14.1 (*)    Neutro Abs 11.6 (*)    All  other components within normal limits  URINALYSIS, ROUTINE W REFLEX MICROSCOPIC - Abnormal; Notable for the following:    Color, Urine STRAW (*)    All other components within normal limits  D-DIMER, QUANTITATIVE (NOT AT [facility]) - Abnormal; Notable for the following:    D-Dimer, Quant 0.55 (*)    All other components within normal limits  CULTURE, BLOOD (ROUTINE X 2)  CULTURE, BLOOD (ROUTINE X 2)  I-STAT CG4 LACTIC ACID, ED  I-STAT CG4 LACTIC ACID, ED  I-STAT BETA HCG BLOOD, ED ([facility], [facility], [facility] ONLY)    EKG  EKG Interpretation None       Radiology Dg Chest 2 View  Result Date: 10/17/2016 CLINICAL DATA:  Pt reports having had an appendectomy on 4/23. Pt states that she has not felt well since surgery  but today is having chills, headache, pain to her back, swelling to legs, fever. EXAM: CHEST  2 VIEW COMPARISON:  None. FINDINGS: The heart size and mediastinal contours are within normal limits. Both lungs are clear. No pleural effusion or pneumothorax. The visualized skeletal structures are unremarkable. IMPRESSION: No active cardiopulmonary disease. Electronically Signed   By: Lajean Manes M.D.   On: 10/17/2016 19:24    Procedures Procedures (including critical care time)  Medications Ordered in ED Medications  acetaminophen (TYLENOL) 325 MG tablet (not administered)  acetaminophen (TYLENOL) tablet 650 mg (650 mg Oral Given 10/17/16 1852)  sodium chloride 0.9 % bolus 1,000 mL (0 mLs Intravenous Stopped 10/18/16 0029)  penicillin g procaine-penicillin g benzathine (BICILLIN-CR) injection 600000-600000 units (1.2 Million Units Intramuscular Given 10/18/16 0030)  ibuprofen (ADVIL,MOTRIN) tablet 600 mg (600 mg Oral Given 10/18/16 0029)     Initial Impression / Assessment and Plan / ED Course  I have reviewed the triage vital signs and the nursing notes.  Pertinent labs & imaging results that were available during my care of the patient were reviewed by me and considered in my medical decision making (see chart for details).     Patient here recently having an appendectomy. She is here with apparent strep throat via positive strep swab. Patient here febrile, no apparent distress, hemodynamically stable. Patient is not tachycardic, not tachypneic. Oxygen saturation 100%. Heart and lung sounds are clear. Abdomen soft and nontender. Negative Murphy sign. No focal tenderness at McBurney's point. No CVA tenderness. No leg swelling or discoloration. Wells criteria for DVT and PE classify her as low risk. Patient has anterior cervical adenopathy and apparent swelling of left tonsil. Presentation not concerning for peritonsillar abscess or spread of infection to deep spaces of the throat; patent airway. Pt  will be given penicillin IM here in ED.  chest x-ray is negative for any acute findings or infiltrate. UA shows no evidence of infection. CBC does show increased WBC to 14.1. LFT's are increased. D-dimer slightly increased. Lactate is negative x 2.  D-dimer is likely increased from recent surgery. She does not complain of any chest pain or shortness of breath at this time.  I do not think DVT study or CTA is warranted at this time. WBC's increased and fever likely from strep.  Dr. Lita Mains saw and evaluated pt. He also spoke with general surgeon who recommended to have pt follow up with PCP for repeat lab on LFT's and lab result are nonspecific and nonconcerning at this time. Dr. Lita Mains agreed with assessment and plan.  Reasons to immediately return to the ED discussed.  Recommended PCP follow up in 1 day regarding today's visit and for  repeat LFT's. Pt appears safe for discharge. Patient verbally understood and agreed with assessment and plan.  Final Clinical Impressions(s) / ED Diagnoses   Final diagnoses:  Strep throat  Elevated LFTs    New Prescriptions New Prescriptions   No medications on file     [place], [place] 10/18/16 [id number]    Julianne Rice, MD 10/25/16 1248

## 2016-10-17 NOTE — ED Notes (Signed)
Leandrew Koyanagi, PA at bedside

## 2016-10-17 NOTE — ED Triage Notes (Signed)
Pt reports having appendectomy on 4/23. Pt states that she has not felt well since surgery but today is having chills, headache, pain to her back, swelling to legs, fever. Denies urinary symptoms.

## 2016-10-17 NOTE — ED Notes (Signed)
emt attempted blood work x 2 at triage with no success, able to get iv started but unable to get all bloodwork drawn from line.

## 2016-10-22 LAB — CULTURE, BLOOD (ROUTINE X 2)
Culture: NO GROWTH
Culture: NO GROWTH
SPECIAL REQUESTS: ADEQUATE
Special Requests: ADEQUATE

## 2016-11-16 ENCOUNTER — Ambulatory Visit: Payer: Self-pay

## 2016-11-16 ENCOUNTER — Ambulatory Visit (INDEPENDENT_AMBULATORY_CARE_PROVIDER_SITE_OTHER): Payer: Self-pay | Admitting: Internal Medicine

## 2016-11-16 ENCOUNTER — Encounter: Payer: Self-pay | Admitting: Internal Medicine

## 2016-11-16 VITALS — BP 120/66 | HR 81 | Temp 98.9°F | Ht 65.0 in | Wt 157.5 lb

## 2016-11-16 DIAGNOSIS — N841 Polyp of cervix uteri: Secondary | ICD-10-CM | POA: Insufficient documentation

## 2016-11-16 DIAGNOSIS — R7401 Elevation of levels of liver transaminase levels: Secondary | ICD-10-CM | POA: Insufficient documentation

## 2016-11-16 DIAGNOSIS — R740 Nonspecific elevation of levels of transaminase and lactic acid dehydrogenase [LDH]: Secondary | ICD-10-CM

## 2016-11-16 NOTE — Addendum Note (Signed)
Addendum  created 11/16/16 1416 by Rica Koyanagi, MD   Sign clinical note

## 2016-11-16 NOTE — Patient Instructions (Addendum)
Ms. Fore it was a pleasure meeting you today.  I have ordered some labs and will call you when the results come back.  Please call the clinic when you have your orange card. At that time, I will place a referral to gynecology.

## 2016-11-17 LAB — CMP14 + ANION GAP
ALT: 20 IU/L (ref 0–32)
ANION GAP: 20 mmol/L — AB (ref 10.0–18.0)
AST: 18 IU/L (ref 0–40)
Albumin/Globulin Ratio: 1.6 (ref 1.2–2.2)
Albumin: 4.5 g/dL (ref 3.5–5.5)
Alkaline Phosphatase: 70 IU/L (ref 39–117)
BUN/Creatinine Ratio: 17 (ref 9–23)
BUN: 13 mg/dL (ref 6–20)
Bilirubin Total: 0.3 mg/dL (ref 0.0–1.2)
CO2: 23 mmol/L (ref 18–29)
CREATININE: 0.75 mg/dL (ref 0.57–1.00)
Calcium: 9.6 mg/dL (ref 8.7–10.2)
Chloride: 99 mmol/L (ref 96–106)
GFR calc Af Amer: 116 mL/min/{1.73_m2} (ref >59)
GFR, EST NON AFRICAN AMERICAN: 101 mL/min/{1.73_m2} (ref >59)
GLUCOSE: 81 mg/dL (ref 65–99)
Globulin, Total: 2.8 g/dL (ref 1.5–4.5)
Potassium: 4.6 mmol/L (ref 3.5–5.2)
Sodium: 142 mmol/L (ref 134–144)
Total Protein: 7.3 g/dL (ref 6.0–8.5)

## 2016-11-17 LAB — HEPATITIS C ANTIBODY: Hep C Virus Ab: <0.1 s/co ratio (ref 0.0–0.9)

## 2016-11-17 LAB — HEPATITIS B SURFACE ANTIGEN: Hepatitis B Surface Ag: NEGATIVE

## 2016-11-17 LAB — HEPATITIS B SURFACE ANTIBODY,QUALITATIVE: HEP B SURFACE AB, QUAL: REACTIVE

## 2016-11-17 LAB — TSH: TSH: 2.81 u[IU]/mL (ref 0.450–4.500)

## 2016-11-17 LAB — HEPATITIS B CORE ANTIBODY, TOTAL: Hep B Core Total Ab: NEGATIVE

## 2016-11-17 NOTE — Progress Notes (Signed)
   CC: Patient is new to the clinic. She is here today to establish care.  HPI:   [name] is a 41 y.o. female with a past medical history of conditions listed below presenting to the clinic to establish care. She is new to the clinic. We discussed her recently discovered cervical polyp. In addition, abnormal LFTs discovered during a recent ED visit were also discussed. Please see problem based charting for the status of the patient's current and chronic medical conditions.   Past Medical History:  Diagnosis Date  . Anemia   . History of precipitous labor and deliveries, antepartum   . Intrahepatic cholestasis of pregnancy   . Seasonal allergies     Review of Systems:  Pertinent positives mentioned in HPI. Remainder of all ROS negative.   Physical Exam:  Vitals:   11/16/16 1338  BP: 120/66  Pulse: 81  Temp: 98.9 F (37.2 C)  TempSrc: Oral  SpO2: 100%  Weight: 157 lb 8 oz (71.4 kg)  Height: 5\' 5"  (1.651 m)   Physical Exam  Constitutional: She is oriented to person, place, and time. She appears well-developed and well-nourished. No distress.  HENT:  Head: Normocephalic and atraumatic.  Mouth/Throat: Oropharynx is clear and moist.  Eyes: Right eye exhibits no discharge. Left eye exhibits no discharge.  Neck: Neck supple. No tracheal deviation present.  Cardiovascular: Normal rate, regular rhythm and intact distal pulses.   Pulmonary/Chest: Effort normal and breath sounds normal. No respiratory distress. She has no wheezes. She has no rales.  Abdominal: Soft. Bowel sounds are normal. She exhibits no distension. There is no rebound and no guarding.  Very mild tenderness to deep palpation in the splenic region.  Musculoskeletal: She exhibits no edema.  Neurological: She is alert and oriented to person, place, and time.  Skin: Skin is warm and dry.    Assessment & Plan:   See Encounters Tab for problem based charting.  Patient discussed with Dr. Lynnae January

## 2016-11-17 NOTE — Assessment & Plan Note (Signed)
Assessment Patient has been pregnant 5 times - 4 vaginal deliveries (term, no complications) and one miscarriage. A cervical polyp was discovered during patient's visit to the family [facility] on 09/13/2016. Per documentation, the polyp was 3 mm in size, round, at 5' clock position, and cherry red in color. Patient has a ParaGard IUD in place. States she was having heavy vaginal bleeding prior to the polyp being discovered. States now she is having regular menstrual cycles which last 5 days and occur every 25 days. Denies having any menorrhagia at present. Does report having a brownish vaginal discharge for the past 6 months. States she has been in a monogamous relationship with a female partner for the past 12 years; no condoms used. Reports having occasional dyspareunia. No sexual activity for the past 4 months. Per family [facility] records, during her visit on 09/13/2016 patient was diagnosed with bacterial vaginosis and treated with Flagyl. Her vaginal bleeding was thought to be secondary to the ParaGard IUD and the cervical polyp.    Plan -Patient is currently in the process of getting an orange card. I explained to her that her cervical polyp will have to be removed. Will refer her to gynecology for further management of the cervical polyp and IUD once she obtains her orange card.

## 2016-11-17 NOTE — Assessment & Plan Note (Addendum)
History of present illness Patient went to the ED on 10/17/2016 and was diagnosed with strep throat at that time. She received a dose of IM penicillin. During that visit, she was found to have elevated LFTs : AST 60, ALT 156, and alkaline phosphatase 137. T bili normal. Per chart, patient had normal LFTs on 10/08/2016. Her liver was unremarkable in appearance and no intrahepatic biliary dilation seen on CT scan of abdomen and pelvis with contrast done on 10/09/2016. Patient had an appendectomy done on 10/10/2016. She also has a history of cholecystectomy done in December 2013.   At present, she denies having any fevers, chills, or sore throat. States her appetite is good and denies having any abdominal pain, nausea, vomiting, diarrhea, constipation, or weight loss. Immigrated from [place] 12 years ago and last travel abroad was 7 years ago. 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 Reports having occasional throbbing sensation in her rectum for the past 1 year. Denies having any melena or hematochezia. Denies any tobacco use, ethanol use, or illicit drug use. She takes an over-the-counter calcium and multivitamin supplement. She does not take any herbal supplements. Takes Tylenol for chronic back pain -2 tablets of Tylenol every other day this past month.  Assessment Her elevated LFTs were likely in the setting of recent illness (strep throat). Repeat LFTs at this visit normal. Labs showing negative Hepatitis B surface antigen and core antibody. Hepatitis B surface antibody positive, consistent with prior vaccination. Hepatitis C antibody negative. HIV antibody nonreactive in April 2018. Hyper or hypothyroidism less likely to explain her recent elevation in LFTs as TSH checked at this visit normal. She does not have any gastrointestinal problems to account for her recent transaminitis. Her gallbladder was removed in 2013. Based on the history, she could possibly have hemorrhoids  but does not endorse any bleeding at present. Nonalcoholic fatty liver disease less likely as she is only slightly overweight (BMI 26.3) and transaminitis has now resolved. Acetaminophen toxicity less likely as she does not use Tylenol heavily. She does not have a history of any autoimmune disorders to account for her recently elevated LFTs. She does not consume alcohol or illicit drugs. No recent toxin exposure. Pt had very mild tenderness to deep palpation in the splenic region on exam, however, the spleen was normal in appearance on recent CT done 10/09/2016.   Plan -Transaminitis has resolved. No further intervention needed at this time.   Addendum 11/19/2016 at 10:15 am: Tried calling the patient to discuss lab results but could not reach her over the phone. Pacific interpretor (ID: [phone]) left a voicemail asking the patient to call the clinic.

## 2016-11-19 NOTE — Progress Notes (Signed)
Internal Medicine Clinic Attending  Case discussed with Dr. Rathoreat the time of the visit. We reviewed the resident's history and exam and pertinent patient test results. I agree with the assessment, diagnosis, and plan of care documented in the resident's note.  

## 2016-11-23 ENCOUNTER — Ambulatory Visit: Payer: Self-pay

## 2017-01-08 ENCOUNTER — Telehealth: Payer: Self-pay | Admitting: Internal Medicine

## 2017-01-08 ENCOUNTER — Other Ambulatory Visit: Payer: Self-pay | Admitting: Internal Medicine

## 2017-01-08 DIAGNOSIS — N841 Polyp of cervix uteri: Secondary | ICD-10-CM

## 2017-01-08 NOTE — Telephone Encounter (Signed)
Referral to gynecology has been placed. Thanks.

## 2017-01-08 NOTE — Telephone Encounter (Signed)
Patient has been approved per [facility] for the [facility] and would like her GYN referral placed.  Please advise.

## 2017-02-04 ENCOUNTER — Telehealth: Payer: Self-pay | Admitting: *Deleted

## 2017-02-04 NOTE — Telephone Encounter (Signed)
SPOKE WITH PATIENT REGARDING HER GYN [place].Marland KitchenSEPTEMBER 10.018 / ARRIVE 3PM. PATENT WAS ABLE TO REPEAT THIS INFORMATION.

## 2017-02-25 ENCOUNTER — Encounter: Payer: Self-pay | Admitting: Obstetrics and Gynecology

## 2017-02-25 ENCOUNTER — Other Ambulatory Visit (HOSPITAL_COMMUNITY)
Admission: RE | Admit: 2017-02-25 | Discharge: 2017-02-25 | Disposition: A | Discharge status: Home / Self Care | Payer: Self-pay | Source: Ambulatory Visit | Attending: Obstetrics and Gynecology | Admitting: Obstetrics and Gynecology

## 2017-02-25 ENCOUNTER — Ambulatory Visit (INDEPENDENT_AMBULATORY_CARE_PROVIDER_SITE_OTHER): Payer: Self-pay | Admitting: Obstetrics and Gynecology

## 2017-02-25 VITALS — BP 115/60 | HR 68 | Ht 63.0 in | Wt 158.0 lb

## 2017-02-25 DIAGNOSIS — N841 Polyp of cervix uteri: Secondary | ICD-10-CM

## 2017-02-25 DIAGNOSIS — Z975 Presence of (intrauterine) contraceptive device: Secondary | ICD-10-CM | POA: Insufficient documentation

## 2017-02-25 NOTE — Patient Instructions (Signed)
Biopsia cervicouterina (Cervical Biopsy) Una biopsia cervicouterina es un procedimiento que se realiza para extraer una pequea muestra de tejido del cuello del tero. El cuello del tero es el extremo inferior del tero que desemboca en la vagina (canal de parto). Pueden realizarle este procedimiento para detectar la presencia de cncer o de crecimientos que pueden volverse cancerosos. Este procedimiento puede hacerse si los resultados de la prueba de Papanicolaou fueron anormales o si el mdico observ una anomala durante un examen plvico. INFORME A SU MDICO:  Cualquier alergia que tenga.  Todos los [employer], incluidos vitaminas, hierbas, gotas oftlmicas, cremas y medicamentos de venta libre.  Problemas previos que usted o los [employer] de su familia hayan tenido con el uso de anestsicos.  Enfermedades de la sangre que tenga.  Si tiene cirugas previas.  Cualquier enfermedad que tenga.  Si est embarazada o podra estarlo.  Si est menstruando o si estar menstruando en el momento del procedimiento. RIESGOS Y COMPLICACIONES En general, se trata de un procedimiento seguro. Sin embargo, pueden presentarse problemas, por ejemplo:  Infeccin.  Hemorragia.  Reacciones alrgicas a los medicamentos o a las sustancias de [place].  Daos a Catering manager u otros rganos. ANTES DEL PROCEDIMIENTO  No se haga duchas vaginales, no mantenga relaciones sexuales, no use tampones ni se coloque medicamentos vaginales antes del procedimiento como se lo haya indicado el mdico.  Siga las indicaciones del mdico respecto de las restricciones para las comidas o las bebidas.  Consulte a su mdico acerca de estos temas: ? Cambiar o suspender los medicamentos que toma habitualmente. Esto es muy importante si toma medicamentos para la diabetes o anticoagulantes. ? Tomar medicamentos, como aspirina e ibuprofeno. Estos medicamentos pueden tener un efecto anticoagulante en la  [place]. No tome estos medicamentos antes del procedimiento si el mdico le indica que no lo haga.  Pueden indicarle un analgsico de venta libre para que tome justo antes del procedimiento.  Pueden pedirle que orine y defeque justo antes del procedimiento. PROCEDIMIENTO  Se quitar la ropa de la cintura para abajo.  Se acostar en una camilla y Glass blower/designer los pies en los estribos.  Para reducir el riesgo de infecciones: ? El equipo mdico se lavar o se desinfectar las manos. ? Le lavarn la piel con jabn.  El mdico usar un instrumento lubricado (espculo) para abrir la vagina. Un instrumento que tiene una lupa y Hali Marry (colposcopio) le permitir al mdico examinar el cuello del tero ms de cerca.  Pueden administrarle un medicamento para adormecer la zona (anestesia local).  El mdico le aplicar una solucin en el cuello del tero. Esta hace que las zonas anormales se tornen de color claro.  El mdico usar un instrumento (pinzas de biopsia) para tomar uno o ms trozos pequeos de tejido que Education administrator.  Si el mdico considera que hay una zona anormal en la porcin del cuello uterino que se comunica con el tero (canal endocervical), usar un instrumento (cureta) para raspar el tejido de la zona. A esto se lo llama curetaje endocervical.  El mdico aplicar una pasta sobre las partes donde tom las muestras para las biopsias para ayudar a Publishing copy. Este procedimiento puede variar segn el mdico y el hospital. DESPUS DEL PROCEDIMIENTO Es su responsabilidad retirar los resultados del procedimiento. Pregntele al mdico o consulte en el departamento donde se realice el procedimiento cundo estarn [employer]. Esta informacin no tiene Marine scientist el consejo del mdico. Asegrese de hacerle al  mdico cualquier pregunta que tenga. Document Released: 03/25/2013 Document Revised: 02/23/2015 Document Reviewed: 10/20/2014 Elsevier Interactive  Patient Education  2018 [employer].

## 2017-02-25 NOTE — Progress Notes (Signed)
Pt presents in referral for cervical polyp. Dx this past spring at the [facility]. ParaGard IUD placed @ 5 yrs ago Monthly cycles Last pap 1 yr ago at [facility] normal per pt.  PE AF VSS Lungs clear Heart RRR Abd soft + BS GU: Nl EGBUS IUD string noted, small cervical polyp noted at the 2 o'clock position  Removal of cervical polyp.   Informed consent obtained. Procedure reviewed Polyp grasped with forceps and twisted off thin base. Monsel's solution applied hemostasis. Pt tolerated well.   A/P Cervical polyp    S/P removal. Will follow up per pathology results

## 2017-02-28 ENCOUNTER — Telehealth: Payer: Self-pay | Admitting: *Deleted

## 2017-02-28 NOTE — Telephone Encounter (Signed)
-----   Message from [name], MD sent at 02/28/2017  2:13 PM EDT ----- Please let pt know that cervical polyp was benign. Continue here routine GYN care with the [facility]. F/U with [place] PRN Thanks Legrand Como

## 2017-02-28 NOTE — Telephone Encounter (Signed)
Used [employer] Spanish interpreter "Wilhemena Durie" [phone] to call patient. There was no answer, a vm was left stating I am calling with non urgent test results. Please return my call.

## 2017-03-05 ENCOUNTER — Telehealth: Payer: Self-pay

## 2017-03-05 NOTE — Telephone Encounter (Signed)
Called patient with pacific interpreter Freida Busman [phone]. No answer. Left message to call office regarding non urgent test results.

## 2017-03-05 NOTE — Telephone Encounter (Signed)
Called patient with [facility] interpreter Freida Busman [phone]. Got vm. Left message for patient to call office for non urgent test results. Letter will be sent to patient.

## 2017-03-07 ENCOUNTER — Telehealth: Payer: Self-pay | Admitting: *Deleted

## 2017-03-07 NOTE — Telephone Encounter (Signed)
Kimberly Copeland left a message in [place] which nurse can not interpret.

## 2017-03-08 NOTE — Telephone Encounter (Addendum)
[place] with [place] [phone] at mobile  Number and left message we are returning her call, please call the office . Called home number- unable to leave message.

## 2017-03-14 NOTE — Telephone Encounter (Signed)
Laurencia left another message she was returning our call. Called [place] with [employer] [phone] and gave her results per Dr. Rip Harbour that her cervical polyp was benign , follow up with health department for routine gyn  care and with [place] as needed for any problems. She voices understanding.

## 2017-08-08 ENCOUNTER — Encounter: Payer: Self-pay | Admitting: Internal Medicine

## 2017-09-28 ENCOUNTER — Ambulatory Visit: Payer: Self-pay | Admitting: Family Medicine

## 2017-12-06 ENCOUNTER — Other Ambulatory Visit: Payer: Self-pay | Admitting: Obstetrics and Gynecology

## 2017-12-06 DIAGNOSIS — Z1231 Encounter for screening mammogram for malignant neoplasm of breast: Secondary | ICD-10-CM

## 2017-12-11 ENCOUNTER — Encounter: Payer: Self-pay | Admitting: *Deleted

## 2018-01-08 ENCOUNTER — Encounter (HOSPITAL_COMMUNITY): Payer: Self-pay | Admitting: *Deleted

## 2018-01-09 ENCOUNTER — Ambulatory Visit (HOSPITAL_COMMUNITY)
Admission: RE | Admit: 2018-01-09 | Discharge: 2018-01-09 | Disposition: A | Discharge status: Home / Self Care | Payer: Self-pay | Source: Ambulatory Visit | Attending: Obstetrics and Gynecology | Admitting: Obstetrics and Gynecology

## 2018-01-09 ENCOUNTER — Other Ambulatory Visit (HOSPITAL_COMMUNITY): Payer: Self-pay | Admitting: *Deleted

## 2018-01-09 ENCOUNTER — Encounter (HOSPITAL_COMMUNITY): Payer: Self-pay

## 2018-01-09 ENCOUNTER — Ambulatory Visit
Admission: RE | Admit: 2018-01-09 | Discharge: 2018-01-09 | Disposition: A | Discharge status: Home / Self Care | Payer: No Typology Code available for payment source | Source: Ambulatory Visit | Attending: Obstetrics and Gynecology | Admitting: Obstetrics and Gynecology

## 2018-01-09 VITALS — BP 118/63 | Ht 63.0 in | Wt 164.2 lb

## 2018-01-09 DIAGNOSIS — Z1231 Encounter for screening mammogram for malignant neoplasm of breast: Secondary | ICD-10-CM

## 2018-01-09 DIAGNOSIS — Z0000 Encounter for general adult medical examination without abnormal findings: Secondary | ICD-10-CM

## 2018-01-09 DIAGNOSIS — Z1239 Encounter for other screening for malignant neoplasm of breast: Secondary | ICD-10-CM

## 2018-01-09 NOTE — Patient Instructions (Signed)
Explained breast self awareness with [facility]. Patient did not need a Pap smear today due to last Pap smear was 02/22/2015. Let her know BCCCP will cover Pap smears every 3 years unless has a history of abnormal Pap smears. Patient scheduled to the free cervical cancer screening at the [facility] Monday, March 31, 2018 at 1810 for her Pap smear. Referred patient to the [facility] for a screening mammogram. Appointment scheduled for Thursday, January 09, 2018 at 1110. Let patient know the [facility] will follow up with her within the next couple weeks with results of mammogram by letter or phone. Doroteo [facility] verbalized understanding.  Rad Gramling, Arvil Chaco, RN 10:26 AM

## 2018-01-09 NOTE — Progress Notes (Signed)
No complaints today.   Pap Smear: Pap smear not completed today. Last Pap smear was 02/22/2015 at the [facility] Department and normal. Per patient has no history of an abnormal Pap smear. Last Pap smear result is in Epic.  Physical exam: Breasts Breasts symmetrical. No skin abnormalities bilateral breasts. No nipple retraction bilateral breasts. No nipple discharge bilateral breasts. No lymphadenopathy. No lumps palpated bilateral breasts. No complaints of pain or tenderness on exam. Referred patient to the [facility] for a screening mammogram. Appointment scheduled for Thursday, January 09, 2018 at 1110.        Pelvic/Bimanual No Pap smear completed today since last Pap smear was 02/22/2015. Pap smear not indicated per BCCCP guidelines.   Smoking History: Patient has never smoked.  Patient Navigation: Patient education provided. Access to services provided for patient through [facility] program. Spanish interpreter provided.   Breast and Cervical Cancer Risk Assessment: Patient has no family history of breast cancer, known genetic mutations, or radiation treatment to the chest before age 70. Patient has no history of cervical dysplasia, immunocompromised, or DES exposure in-utero.  Risk Assessment    Risk Scores      01/09/2018   Last edited by: Loletta Parish, RN   5-year risk: 0.3 %   Lifetime risk: 5.2 %         Used Spanish interpreter Rudene Anda from [place].

## 2018-01-10 ENCOUNTER — Encounter (HOSPITAL_COMMUNITY): Payer: Self-pay | Admitting: *Deleted

## 2018-01-13 ENCOUNTER — Other Ambulatory Visit: Payer: Self-pay | Admitting: Obstetrics and Gynecology

## 2018-01-13 ENCOUNTER — Encounter (HOSPITAL_COMMUNITY): Payer: Self-pay | Admitting: Emergency Medicine

## 2018-01-13 ENCOUNTER — Emergency Department (HOSPITAL_COMMUNITY)
Admission: EM | Admit: 2018-01-13 | Discharge: 2018-01-13 | Disposition: A | Discharge status: Home / Self Care | Payer: No Typology Code available for payment source | Attending: Emergency Medicine | Admitting: Emergency Medicine

## 2018-01-13 ENCOUNTER — Other Ambulatory Visit: Payer: Self-pay

## 2018-01-13 DIAGNOSIS — R928 Other abnormal and inconclusive findings on diagnostic imaging of breast: Secondary | ICD-10-CM

## 2018-01-13 DIAGNOSIS — Z79899 Other long term (current) drug therapy: Secondary | ICD-10-CM | POA: Insufficient documentation

## 2018-01-13 DIAGNOSIS — M542 Cervicalgia: Secondary | ICD-10-CM

## 2018-01-13 LAB — BASIC METABOLIC PANEL
ANION GAP: 10 (ref 5–15)
BUN: 10 mg/dL (ref 6–20)
CALCIUM: 9.3 mg/dL (ref 8.9–10.3)
CO2: 25 mmol/L (ref 22–32)
Chloride: 105 mmol/L (ref 98–111)
Creatinine, Ser: 0.66 mg/dL (ref 0.44–1.00)
GFR calc non Af Amer: >60 mL/min (ref >60)
Glucose, Bld: 122 mg/dL — ABNORMAL HIGH (ref 70–99)
Potassium: 3.5 mmol/L (ref 3.5–5.1)
SODIUM: 140 mmol/L (ref 135–145)

## 2018-01-13 LAB — URINALYSIS, ROUTINE W REFLEX MICROSCOPIC
BILIRUBIN URINE: NEGATIVE
GLUCOSE, UA: NEGATIVE mg/dL
KETONES UR: NEGATIVE mg/dL
LEUKOCYTES UA: NEGATIVE
Nitrite: NEGATIVE
PH: 6 (ref 5.0–8.0)
Protein, ur: NEGATIVE mg/dL
Specific Gravity, Urine: 1.027 (ref 1.005–1.030)

## 2018-01-13 LAB — CBC
HCT: 39.9 % (ref 36.0–46.0)
HEMOGLOBIN: 12.8 g/dL (ref 12.0–15.0)
MCH: 27.2 pg (ref 26.0–34.0)
MCHC: 32.1 g/dL (ref 30.0–36.0)
MCV: 84.7 fL (ref 78.0–100.0)
PLATELETS: 296 10*3/uL (ref 150–400)
RBC: 4.71 MIL/uL (ref 3.87–5.11)
RDW: 12.8 % (ref 11.5–15.5)
WBC: 8.9 10*3/uL (ref 4.0–10.5)

## 2018-01-13 LAB — I-STAT BETA HCG BLOOD, ED (MC, WL, AP ONLY): I-stat hCG, quantitative: <5 m[IU]/mL (ref <5)

## 2018-01-13 MED ORDER — CYCLOBENZAPRINE HCL 10 MG PO TABS: 10.0000 mg | ORAL_TABLET | Freq: Three times a day (TID) | ORAL | 0 refills | Status: DC | PRN

## 2018-01-13 MED ORDER — IBUPROFEN 600 MG PO TABS: 600.0000 mg | ORAL_TABLET | Freq: Three times a day (TID) | ORAL | 0 refills | Status: DC | PRN

## 2018-01-13 NOTE — Discharge Instructions (Addendum)
It is important that you develop a relationship with a primary care physician

## 2018-01-13 NOTE — ED Triage Notes (Addendum)
Patient complains of throbbing pain posterior to left ear since Saturday, also complains of constant fatigue for the last year. Reports approximately one month ago her young son pushed something in her left ear while she was asleep. Patient alert, oriented, and ambulating independently with steady gait.

## 2018-01-13 NOTE — ED Notes (Signed)
Pt st's she has had a headache x's 2 days without any nausea or vomiting.  Pt alert and oriented x's 3

## 2018-01-13 NOTE — ED Provider Notes (Signed)
Patient placed in Quick Look pathway, seen and evaluated   Chief Complaint: Pain behind left ear, fatigue  HPI:   Patient reports for the past 3 days she has had severe pain behind the left ear and pain in the left ear, no noted redness or swelling.  Patient also reports for the past year she has had worsening constant fatigue and now she just has no energy to do anything.  ROS: + Ear pain, headache, fatigue. -Fevers, chills, chest pain, shortness of breath, abdominal pain  Physical Exam:   Gen: No distress  Neuro: Awake and Alert  Skin: Warm    Focused Exam: Bilateral TMs clear, no redness or swelling over the mastoid.  Normal neurologic exam.  Heart RRR lungs CTA  Initiation of care has begun. The patient has been counseled on the process, plan, and necessity for staying for the completion/evaluation, and the remainder of the medical screening examination    Janet Berlin 01/13/18 1410    Duffy Bruce, MD 01/13/18 1654

## 2018-01-13 NOTE — ED Provider Notes (Signed)
[facility] EMERGENCY DEPARTMENT Provider Note   CSN: [id number] Arrival date & time: 01/13/18  1337     History   Chief Complaint Chief Complaint  Patient presents with  . Headache    HPI Kimberly Copeland is a 42 y.o. female.  HPI 42 year old female presents the emergency department complaints of 2 days of left-sided neck pain with radiation up into her head.  No oral or dental complaints.  Denies nausea vomiting.  Eating and drinking normally.  No fevers or chills.  No recent head injury or trauma.  No weakness of her arms or legs.  No shortness of breath.  No chest pain.  Symptoms are mild in severity.  No improvement with Tylenol   Past Medical History:  Diagnosis Date  . Anemia   . History of precipitous labor and deliveries, antepartum   . Intrahepatic cholestasis of pregnancy   . Seasonal allergies     Patient Active Problem List   Diagnosis Date Noted  . IUD contraception 02/25/2017  . Cervical polyp 11/16/2016    Past Surgical History:  Procedure Laterality Date  . CHOLECYSTECTOMY  05/17/2012   Procedure: LAPAROSCOPIC CHOLECYSTECTOMY WITH INTRAOPERATIVE CHOLANGIOGRAM;  Surgeon: Zenovia Jarred, MD;  Location: [facility];  Service: General;  Laterality: N/A;  . GYNECOLOGIC CRYOSURGERY    . LAPAROSCOPIC APPENDECTOMY N/A 10/09/2016   Procedure: APPENDECTOMY LAPAROSCOPIC;  Surgeon: Erroll Luna, MD;  Location: [facility];  Service: General;  Laterality: N/A;     OB History    Gravida  5   Para  4   Term  4   Preterm      AB  1   Living  4     SAB  1   TAB      Ectopic      Multiple      Live Births  4            Home Medications    Prior to Admission medications   Medication Sig Start Date End Date Taking? Authorizing Provider  Calcium Carbonate (CALTRATE 600 PO) Take by mouth.    [provider]  Multiple Vitamins-Minerals (MULTIVITAMIN WITH MINERALS) tablet Take 1 tablet by mouth daily.    [provider]    Family History Family History  Problem Relation Age of Onset  . Peripheral vascular disease Mother   . Hyperlipidemia Mother   . Peripheral vascular disease Maternal Grandmother   . Anemia Maternal Grandmother   . Peripheral vascular disease Maternal Grandfather     Social History Social History   Tobacco Use  . Smoking status: Never Smoker  . Smokeless tobacco: Never Used  Substance Use Topics  . Alcohol use: No  . Drug use: No     Allergies   Patient has no known allergies.   Review of Systems Review of Systems  All other systems reviewed and are negative.    Physical Exam Updated Vital Signs BP 109/72   Pulse (!) 56   Temp 98.2 F (36.8 C) (Oral)   Resp 16   LMP 01/09/2018   SpO2 100%   Physical Exam  Constitutional: She is oriented to person, place, and time. She appears well-developed and well-nourished.  HENT:  Head: Normocephalic and atraumatic.  Bilateral TMs are normal.  Left ear is normal.  Posterior pharynx is normal.  No obvious dental abnormalities.  No Trismus or malocclusion  Eyes: EOM are normal.  Neck: Normal range of motion. Neck supple.  Posterior  left neck normal.  Anterior neck normal.  Pulmonary/Chest: Effort normal.  Abdominal: She exhibits no distension.  Musculoskeletal: Normal range of motion.  Neurological: She is alert and oriented to person, place, and time.  Psychiatric: She has a normal mood and affect.  Nursing note and vitals reviewed.    ED Treatments / Results  Labs (all labs ordered are listed, but only abnormal results are displayed) Labs Reviewed  BASIC METABOLIC PANEL - Abnormal; Notable for the following components:      Result Value   Glucose, Bld 122 (*)    All other components within normal limits  URINALYSIS, ROUTINE W REFLEX MICROSCOPIC - Abnormal; Notable for the following components:   APPearance HAZY (*)    Hgb urine dipstick LARGE (*)    Bacteria, UA RARE (*)    All other  components within normal limits  CBC  CBG MONITORING, ED  I-STAT BETA HCG BLOOD, ED ([facility], [facility], [facility] ONLY)    EKG EKG Interpretation  Date/Time:  Monday January 13 2018 13:53:12 EDT Ventricular Rate:  84 PR Interval:  146 QRS Duration: 72 QT Interval:  362 QTC Calculation: 427 R Axis:   66 Text Interpretation:  Normal sinus rhythm Nonspecific T wave abnormality Abnormal ECG No significant change was found Confirmed by Jola Schmidt [phone]) on 01/13/2018 6:50:26 PM   Radiology No results found.  Procedures Procedures (including critical care time)  Medications Ordered in ED Medications - No data to display   Initial Impression / Assessment and Plan / ED Course  I have reviewed the triage vital signs and the nursing notes.  Pertinent labs & imaging results that were available during my care of the patient were reviewed by me and considered in my medical decision making (see chart for details).     Nonspecific left-sided neck pain.  Normal neurologic exam.  No indication for imaging or additional work-up at this time.  Patient will need a primary care physician.  Final Clinical Impressions(s) / ED Diagnoses   Final diagnoses:  None    ED Discharge Orders    None       Jola Schmidt, MD 01/13/18 [phone]

## 2018-01-15 ENCOUNTER — Inpatient Hospital Stay: Payer: No Typology Code available for payment source | Attending: Obstetrics and Gynecology | Admitting: *Deleted

## 2018-01-15 ENCOUNTER — Inpatient Hospital Stay: Payer: No Typology Code available for payment source

## 2018-01-15 VITALS — BP 120/78 | Ht 64.0 in | Wt 168.0 lb

## 2018-01-15 DIAGNOSIS — Z0000 Encounter for general adult medical examination without abnormal findings: Secondary | ICD-10-CM

## 2018-01-15 LAB — HEMOGLOBIN A1C
Hgb A1c MFr Bld: 5.5 % (ref 4.8–5.6)
Mean Plasma Glucose: 111.15 mg/dL

## 2018-01-15 LAB — LIPID PANEL
CHOL/HDL RATIO: 5 ratio
Cholesterol: 263 mg/dL — ABNORMAL HIGH (ref 0–200)
HDL: 53 mg/dL (ref >40)
LDL Cholesterol: 158 mg/dL — ABNORMAL HIGH (ref 0–99)
Triglycerides: 261 mg/dL — ABNORMAL HIGH (ref <150)
VLDL: 52 mg/dL — AB (ref 0–40)

## 2018-01-15 NOTE — Progress Notes (Signed)
[facility] initial screening  Spanish Interpreter- Almyra Free Sowell   Clinical Measurement:  Height: 64in Weight: 168lb Blood Pressure: 124/78 Blood Pressure #2: 120/72  Fasting Labs Drawn Today, will review with patient when they result.  Medical History:  Patient states that she has not been diagnosed with high cholesterol, high blood pressure, diabetes or heart disease.  Medications:  Patients states she is not taking any medications for high cholesterol, high blood pressure or diabetes.  She is not taking aspirin daily to prevent heart attack or stroke.    Blood pressure, self measurement:  Patients states she does not measure blood pressure at home.    Nutrition:  Patient states she eats 1 cup of fruit and 0.5 cups of vegetables in an average day.  Patient states she does not eat fish regularly, she eats less  than half a serving of whole grains daily. She drinks more than 36 ounces of beverages with added sugar weekly.  She is not  currently watching her sodium intake.  She has not had any drinks containing alcohol in the last seven days.    Physical activity:  Patient states that she gets 0 minutes of moderate exercise in a week.  She gets 0 minutes of vigorous exercise per week.    Smoking status:  Patient states she has never smoked and is not around any smokers.   Quality of life:  Patient states that she has had 0 bad physical days out of the last 30 days. In the last 2 weeks, she has had 0 days that she has felt down or depressed. She has had 0 days in the last 2 weeks that she has had little interest or pleasure in doing things.   Risk reduction and counseling:  Patient states she wants to lose weight and increase fruit and vegetable intake.  I encouraged her to continue with current exercise regimen and increase vegetable and fruit intake.  Navigation:  I will notify patient of lab results.  Patient is aware of 2 more health coaching sessions and a follow up.

## 2018-01-17 ENCOUNTER — Telehealth (HOSPITAL_COMMUNITY): Payer: Self-pay | Admitting: *Deleted

## 2018-01-17 NOTE — Telephone Encounter (Signed)
  Health Coaching 2  [place]- cholesterol 263, triglycerides 261, LDL 158, HDL 53,  Hemoglobin A1C 5.5, mean plasma glucose 111.5  Patient is aware and understands these results.  Goals- Patient  states she is working on her goals. She is walking 3 times per week in 30 - 40 minute sessions. Patient states she is eating more fruits and vegetables.  She is cooking with olive oil and avocado oil.   Navigation: Patient is aware of 1 more health coaching sessions and a follow up.    Time- 10 minutes

## 2018-01-20 ENCOUNTER — Telehealth (HOSPITAL_COMMUNITY): Payer: Self-pay | Admitting: *Deleted

## 2018-01-20 ENCOUNTER — Ambulatory Visit
Admission: RE | Admit: 2018-01-20 | Discharge: 2018-01-20 | Disposition: A | Discharge status: Home / Self Care | Payer: No Typology Code available for payment source | Source: Ambulatory Visit | Attending: Obstetrics and Gynecology | Admitting: Obstetrics and Gynecology

## 2018-01-20 ENCOUNTER — Other Ambulatory Visit: Payer: Self-pay | Admitting: Obstetrics and Gynecology

## 2018-01-20 DIAGNOSIS — R928 Other abnormal and inconclusive findings on diagnostic imaging of breast: Secondary | ICD-10-CM

## 2018-01-20 DIAGNOSIS — N6489 Other specified disorders of breast: Secondary | ICD-10-CM

## 2018-01-20 NOTE — Telephone Encounter (Signed)
  Addendum  [place]  Patient referred to [facility] on February 04 2018 at 3pm for her lipid panel results.

## 2018-02-04 ENCOUNTER — Other Ambulatory Visit: Payer: Self-pay

## 2018-02-04 ENCOUNTER — Encounter: Payer: Self-pay | Admitting: Internal Medicine

## 2018-02-04 ENCOUNTER — Ambulatory Visit (INDEPENDENT_AMBULATORY_CARE_PROVIDER_SITE_OTHER): Payer: Self-pay | Admitting: Internal Medicine

## 2018-02-04 VITALS — BP 107/67 | HR 74 | Temp 98.4°F | Ht 64.75 in | Wt 165.0 lb

## 2018-02-04 DIAGNOSIS — F419 Anxiety disorder, unspecified: Secondary | ICD-10-CM

## 2018-02-04 DIAGNOSIS — E785 Hyperlipidemia, unspecified: Secondary | ICD-10-CM | POA: Insufficient documentation

## 2018-02-04 DIAGNOSIS — Z79899 Other long term (current) drug therapy: Secondary | ICD-10-CM

## 2018-02-04 DIAGNOSIS — F32A Depression, unspecified: Secondary | ICD-10-CM | POA: Insufficient documentation

## 2018-02-04 DIAGNOSIS — F329 Major depressive disorder, single episode, unspecified: Secondary | ICD-10-CM

## 2018-02-04 DIAGNOSIS — K219 Gastro-esophageal reflux disease without esophagitis: Secondary | ICD-10-CM | POA: Insufficient documentation

## 2018-02-04 MED ORDER — SERTRALINE HCL 50 MG PO TABS: 50.0000 mg | ORAL_TABLET | Freq: Every day | ORAL | 3 refills | Status: DC

## 2018-02-04 NOTE — Assessment & Plan Note (Signed)
Assessment: Recent labs from 12/2017 showed total cholesterol of 263 and LDL of 158. 10-year ASCVD risk is 0.8%. Statin therapy is not warranted as 10-year ASCVD risk is less than 10% and LDL is less than 190. Discussed lifestyle modifications regarding diet and exercise.   Plan 1. No statin therapy warranted. 2. Lifestyle modifications.

## 2018-02-04 NOTE — Patient Instructions (Addendum)
It was a pleasure meeting you today, Ms. Labella!   For your sad mood -We will get lab work today (TSH) -Start taking sertraline 50 mg daily.  For your reflux -Avoid spicy foods -When you do experience reflux symptoms, you can try over the counter medications like Tums.  Please return to clinic to follow up in one month.

## 2018-02-04 NOTE — Assessment & Plan Note (Signed)
HPI: Kimberly Copeland reports a burning sensation when she eats spicy foods. She denies coughing, nausea, vomiting, or abdominal pain. She denies chest pain and shortness of breath. She has had this problem for over 18 years. Sometimes she will use over the counter antacids which relieve the discomfort.   Assessment: GERD  Plan 1. Avoid spicy foods 2. Use over the counter antacids when symptomatic 3. If this problem becomes more bothersome in the future, can consider PPI

## 2018-02-04 NOTE — Progress Notes (Signed)
   CC: fatigue   HPI: Kimberly Copeland is a 42 yo female with no significant past medical history who presents for fatigue. Hyperlipidemia and GERD were also discussed. Please see problem based charting for the status of the patient's current and chronic medical conditions.  Past Medical History:  Diagnosis Date  . Anemia   . History of precipitous labor and deliveries, antepartum   . Intrahepatic cholestasis of pregnancy   . Seasonal allergies     Review of Systems:   Pertinent positives mentioned in HPI. Remainder of all ROS    Physical Exam: Vitals:   02/04/18 1504  BP: 107/67  Pulse: 74  Temp: 98.4 F (36.9 C)  TempSrc: Oral  SpO2: 98%  Weight: 165 lb (74.8 kg)  Height: 5' 4.75" (1.645 m)   Physical Exam  Constitutional: Well-developed, well-nourished, and in no distress.  Eyes: Pupils are equal, round, and reactive to light. EOM are normal. No subconjunctival pallor.   HENT: No thyromegaly or palpable thyroid masses. Cardiovascular: Normal rate and regular rhythm. No murmurs, rubs, or gallops. Distal pulses intact. Pulmonary/Chest: Effort normal. Clear to auscultation bilaterally. No wheezes, rales, or rhonchi. Abdominal: Bowel sounds present. Soft, non-distended, non-tender. Ext: No lower extremity edema. Skin: Warm and dry. No rashes or wounds.   Assessment & Plan:   See Encounters Tab for problem based charting.  Patient seen with Dr. Dareen Piano

## 2018-02-04 NOTE — Assessment & Plan Note (Signed)
HPI: Kimberly Copeland reports a two-year history of fatigue, generalized weakness, and depressed mood. She reports that she has trouble sleeping because her mind is running, worrying about normal life stressors. She has difficulty both falling asleep and staying asleep and states that she gets about 4 hours of interrupted sleep every night. She denies anhedonia as she still finds pleasure in things like running and singing. She reports increased appetite, decreased appetite, and occasional psychomotor retardation (like she's moving in slow motion). She denies feelings of guilt, decreased concentration, SI/HI/AVH. She has no history of mental illness. She denies hematochezia, changes in menses, changes in weight, changes in her hair or skin, joint pains, or fevers/chills.   Assessment: [name] symptoms are most likely due to depression and anxiety. Her poor sleep is likely contributing to her poor energy level. Differential also includes hypothyroidism. CBC from 7/29 showed normal hemoglobin.  Plan 1. Start Sertraline 50 mg daily 2. TSH today 3. Reassess symptoms in one month at follow-up

## 2018-02-05 LAB — TSH: TSH: 3.53 u[IU]/mL (ref 0.450–4.500)

## 2018-02-06 NOTE — Progress Notes (Signed)
Internal Medicine Clinic Attending  I saw and evaluated the patient.  I personally confirmed the key portions of the history and exam documented by Dr. Dorrell and I reviewed pertinent patient test results.  The assessment, diagnosis, and plan were formulated together and I agree with the documentation in the resident's note. 

## 2018-02-25 ENCOUNTER — Encounter (INDEPENDENT_AMBULATORY_CARE_PROVIDER_SITE_OTHER): Payer: Self-pay

## 2018-02-25 ENCOUNTER — Ambulatory Visit: Payer: Self-pay

## 2018-02-26 ENCOUNTER — Telehealth (HOSPITAL_COMMUNITY): Payer: Self-pay | Admitting: *Deleted

## 2018-02-26 NOTE — Telephone Encounter (Signed)
Health coaching 3  Goal- Patient states she has been making changes to her diet by increasing vegetable, fruit and water consumption.  I encouraged patient to eat more lean meats,fish such as salmon, mackerel and tuna. I encouraged her to eat less prepackaged snacks and to eat small amounts of nuts, such as walnuts and almonds.  Patient states she has been exercising,which includes walking 3 to 4 times weekly. I encouraged her to exercise at least 150 minutes weekly. Patient states she has lost weight (about 5 lbs.)  Navigation:  Patient is aware of a follow up session  Time- 10 minutes.

## 2018-03-05 ENCOUNTER — Ambulatory Visit (INDEPENDENT_AMBULATORY_CARE_PROVIDER_SITE_OTHER): Payer: Self-pay | Admitting: Internal Medicine

## 2018-03-05 ENCOUNTER — Other Ambulatory Visit: Payer: Self-pay

## 2018-03-05 DIAGNOSIS — K0889 Other specified disorders of teeth and supporting structures: Secondary | ICD-10-CM

## 2018-03-05 DIAGNOSIS — J302 Other seasonal allergic rhinitis: Secondary | ICD-10-CM

## 2018-03-05 DIAGNOSIS — F329 Major depressive disorder, single episode, unspecified: Secondary | ICD-10-CM

## 2018-03-05 DIAGNOSIS — K219 Gastro-esophageal reflux disease without esophagitis: Secondary | ICD-10-CM

## 2018-03-05 NOTE — Progress Notes (Signed)
   CC: Dental Pain  HPI:  Kimberly Copeland is a 42 y.o. female with a history of seasonal allergies, GERD, depression who presents with dental pain.  Patient reports a several month history of left upper and left lower tooth pain. She reports left lower tooth pain whenever she eats sugary food and cold foods which has worsened over the last several months. She also feels as though the left upper tooth is loose stating "I can feel my tooth move whenever I push it with my tongue."  She denies any systemic symptoms. She has never been seen by a dentist.  Past Medical History:  Diagnosis Date  . Anemia   . History of precipitous labor and deliveries, antepartum   . Intrahepatic cholestasis of pregnancy   . Seasonal allergies    Review of Systems:   Review of Systems  Constitutional: Negative for chills and fever.  HENT:       Left upper and lower tooth pain  Skin: Negative for rash.  All other systems reviewed and are negative.   Physical Exam:  Vitals:   03/05/18 1511  BP: 124/67  Pulse: 76  Temp: 98.6 F (37 C)  TempSrc: Oral  SpO2: 99%  Weight: 158 lb 6.4 oz (71.8 kg)  Height: 5\' 4"  (1.626 m)   Physical Exam  Constitutional: She appears well-developed and well-nourished.  HENT:  Head: Normocephalic and atraumatic.  Mouth/Throat:    Eyes: EOM are normal.  Cardiovascular: Normal rate, regular rhythm and normal heart sounds.  Pulmonary/Chest: Effort normal.  Lymphadenopathy:    She has no cervical adenopathy.  Skin: Skin is warm and dry.  Psychiatric: She has a normal mood and affect. Her behavior is normal.  Nursing note and vitals reviewed.   Assessment & Plan:   See Encounters Tab for problem based charting.  Patient seen with Dr. Beryle Beams

## 2018-03-05 NOTE — Assessment & Plan Note (Signed)
Assessment: Kimberly Copeland has a several month history of upper and left lower tooth pain. No clear abnormalities noted on exam. She has the orange card and I will refer to a dentist for further evaluation.  Plan: 1. Refer to dentist

## 2018-03-05 NOTE — Patient Instructions (Signed)
Te referir a Oceanographer. Deberan llamarte en las prximas semanas para hacer una cita.

## 2018-03-06 NOTE — Progress Notes (Signed)
Medicine attending: I personally interviewed and briefly examined this patient on the day of the patient visit and reviewed pertinent clinical ,laboratory data  with resident physician Dr. Nita Sickle and we discussed a management plan. No obvious infection/dental abscess. Teeth in good repair. She feels upper incisor is loose. I don't think so. We will make referral to dentist

## 2018-03-26 ENCOUNTER — Ambulatory Visit (INDEPENDENT_AMBULATORY_CARE_PROVIDER_SITE_OTHER): Payer: Self-pay | Admitting: Internal Medicine

## 2018-03-26 ENCOUNTER — Other Ambulatory Visit: Payer: Self-pay

## 2018-03-26 VITALS — BP 122/60 | HR 99 | Temp 98.2°F | Ht 62.0 in | Wt 151.8 lb

## 2018-03-26 DIAGNOSIS — K594 Anal spasm: Secondary | ICD-10-CM

## 2018-03-26 DIAGNOSIS — K219 Gastro-esophageal reflux disease without esophagitis: Secondary | ICD-10-CM

## 2018-03-26 DIAGNOSIS — K5900 Constipation, unspecified: Secondary | ICD-10-CM

## 2018-03-26 MED ORDER — NITROGLYCERIN 2 % TD OINT: 0.5000 [in_us] | TOPICAL_OINTMENT | TRANSDERMAL | 0 refills | Status: DC

## 2018-03-26 MED ORDER — DOCUSATE SODIUM 100 MG PO CAPS: 100.0000 mg | ORAL_CAPSULE | Freq: Every day | ORAL | 1 refills | Status: DC

## 2018-03-26 NOTE — Progress Notes (Signed)
   CC: Rectal pain  HPI:  [name] is a 42 y.o. female with past medical history outlined below here for rectal pain. For the details of today's visit, please refer to the assessment and plan.  Past Medical History:  Diagnosis Date  . Anemia   . History of precipitous labor and deliveries, antepartum   . Intrahepatic cholestasis of pregnancy   . Seasonal allergies     Review of Systems  Gastrointestinal: Positive for constipation.       Rectal pain    Physical Exam:  Vitals:   03/26/18 1457  BP: 122/60  Pulse: 99  Temp: 98.2 F (36.8 C)  TempSrc: Oral  SpO2: 99%  Weight: 151 lb 12.8 oz (68.9 kg)  Height: 5\' 2"  (1.575 m)    Constitutional: NAD, appears comfortable Cardiovascular: RRR, no murmurs, rubs, or gallops.  GU: Rectal exam without evidence of anal fissure or external hemorrhoids. DRE with good rectal tone, no impaction, and FOBT negative  Extremities: Warm and well perfused.  No edema.  Psychiatric: Normal mood and affect  Assessment & Plan:   See Encounters Tab for problem based charting.  Patient discussed with Kimberly Copeland

## 2018-03-26 NOTE — Patient Instructions (Addendum)
[name],  Fue un placer conocerte. Lamento lo de tu dolor. Te he referido a Radiation protection practitioner. Se le contactar para programar esto.   Para su estreimiento, por favor tome colase [employer]. Usted puede tomar miralax adems si contina teniendo estreimiento.   Te he dado una receta para la pomada de nitroglicerina. Puede aplicar esto en el recto segn sea necesario cuando se producen espasmos en el dolor. Esto ayudar a aumentar el flujo sanguneo a la zona y [employer].   Por favor, siga con nosotros de nuevo en 1 mes. Si tiene alguna pregunta o inquietud, llame a Haskell Riling al [phone] o despus de horas llame al [phone] y pregunte por el residente de medicina interna de [place]. Gracias!   Kimberly Copeland,  It was a pleasure to meet you. I am sorry to hear about your pain. I have referred you to a gastroenterologist. You will be contacted to schedule this.   For your constipation, please take colase daily. You may take miralax in addition if you continue to have constipation.   I have given you a prescription for nitroglycerin ointment. You may apply this to your rectum as needed when pain spasms occur. This will help increase blood flow to the area and relax your muscles.   Please follow up with [place] again in 1 month. If you have any questions or concerns, call our clinic at [phone] or after hours call [phone] and ask for the internal medicine resident on call. Thank you!  Dr. Philipp Ovens    Proctalgia fugaz (Proctalgia Fugax) La proctalgia fugaz se caracteriza por episodios muy cortos de Energy manager. El recto es la ltima porcin del intestino grueso. El dolor puede durar segundos o minutos. Los episodios suelen ocurrir durante la noche e interrumpen el sueo. Esta afeccin no es un signo de cncer, pero es posible que el mdico quiera descartar la presencia de algunas otras enfermedades. CAUSAS Se desconoce la causa de esta afeccin.  Una causa posible puede ser el espasmo de los msculos plvicos o de la porcin inferior del intestino grueso. SNTOMAS El nico sntoma de esta afeccin es Conservation officer, historic buildings en el recto.  El dolor puede ser intenso o agudo  y durar solo unos pocos segundos o hasta 16minutos.  Puede aparecer durante la noche e interrumpir el sueo. DIAGNSTICO La afeccin se puede diagnosticar al descartar otros problemas que podran ser la causa del dolor. El diagnstico puede incluir lo siguiente:  Examen fsico e historia clnica.  Diferentes estudios, por ejemplo: ? Anoscopia. En este estudio, se introduce un anoscopio en el recto para detectar la presencia de anomalas. ? Enema de bario. En este estudio, se toman radiografas despus de administrar por va rectal una sustancia blanca terrosa llamada bario. El bario permite observar los problemas con mayor facilidad porque se lo visualiza claramente en las radiografas. ? Anlisis de sangre para descartar la presencia de otras infecciones o de otros problemas. TRATAMIENTO No hay un tratamiento especfico para curar esta afeccin. Las opciones de tratamiento son las siguientes:  Medicamentos.  Baos tibios.  Tcnicas de relajacin.  Masajes suaves en la zona del dolor.  Biorretroalimentacin. Bell los medicamentos de venta libre y los recetados solamente como se lo haya indicado el mdico.  Siga las indicaciones del mdico acerca de la dieta.  Siga las indicaciones del mdico con respecto al reposo y a la actividad fsica.  Intente tomar baos tibios,  masajear la zona o emplear tcnicas de Civil Service fast streamer se lo haya indicado el mdico.  Concurra a todas las visitas de control como se lo haya indicado el mdico. Esto es importante. SOLICITE ATENCIN MDICA SI:  Presenta nuevos sntomas.  El dolor no se alivia tan pronto como es habitual. Esta informacin no tiene Marine scientist el consejo del  mdico. Asegrese de hacerle al mdico cualquier pregunta que tenga. Document Released: 03/14/2005 Document Revised: 02/23/2015 Document Reviewed: 08/30/2014 Elsevier Interactive Patient Education  Kimberly Copeland.

## 2018-03-27 ENCOUNTER — Encounter: Payer: Self-pay | Admitting: Internal Medicine

## 2018-03-27 NOTE — Assessment & Plan Note (Signed)
Patient is here with complaint of rectal pain x2 weeks.  She reports intermittent episodes of intense pain in her rectum that occur at random.  Episodes last approximately 2 minutes and resolve on their own.  She denies association with defecation.  No blood in her stool.  She does have issues with chronic constipation, however this is been ongoing for years.  She takes over-the-counter stool softeners and MiraLAX every 3-4 days as needed to have a bowel movement.  Of note, she does report a history of colitis at the age of 39 when living in [place].  She does not recall etiology or treatment.  On exam today, rectal exam is largely unremarkable.  No anal fissures, no external hemorrhoids.  Digital rectal exam was performed with normal rectal tone, no impaction, and fecal occult blood test was negative.  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  We will treat empirically with as needed topical nitro ointment.  However given her history of colitis, we will refer to gastroenterology to rule out IBD. --Topical nitro ointment 2% as needed for rectal pain --Referral to gastroenterology

## 2018-03-27 NOTE — Assessment & Plan Note (Signed)
Patient has issues with chronic constipation ongoing for years.  She currently takes over-the-counter stool softeners and MiraLAX every 3-4 days to induce a bowel movement.  Unclear if her chronic constipation could be playing a role in her new onset rectal pain.  We discussed treatment to monitor for improvement.  We will schedule daily Colace, instructed patient to take additional as needed MiraLAX if Colace is not effective.  We are also referring to gastroenterology. --Colace daily and MiraLAX PRN --GI referral

## 2018-03-28 NOTE — Progress Notes (Signed)
Case discussed with Dr. Guilloud at the time of the visit.  We reviewed the resident's history and exam and pertinent patient test results.  I agree with the assessment, diagnosis and plan of care documented in the resident's note. 

## 2018-04-15 ENCOUNTER — Encounter: Payer: Self-pay | Admitting: Internal Medicine

## 2018-04-15 ENCOUNTER — Ambulatory Visit (INDEPENDENT_AMBULATORY_CARE_PROVIDER_SITE_OTHER): Payer: Self-pay | Admitting: Internal Medicine

## 2018-04-15 VITALS — BP 100/80 | HR 72 | Ht 64.0 in | Wt 157.6 lb

## 2018-04-15 DIAGNOSIS — R140 Abdominal distension (gaseous): Secondary | ICD-10-CM

## 2018-04-15 DIAGNOSIS — K219 Gastro-esophageal reflux disease without esophagitis: Secondary | ICD-10-CM

## 2018-04-15 DIAGNOSIS — K5900 Constipation, unspecified: Secondary | ICD-10-CM

## 2018-04-15 DIAGNOSIS — K6289 Other specified diseases of anus and rectum: Secondary | ICD-10-CM

## 2018-04-15 MED ORDER — NA SULFATE-K SULFATE-MG SULF 17.5-3.13-1.6 GM/177ML PO SOLN: 1.0000 | Freq: Once | ORAL | 0 refills | Status: AC

## 2018-04-15 NOTE — Progress Notes (Signed)
HISTORY OF PRESENT ILLNESS:  Kimberly Copeland is a 42 y.o. 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  The patient is accompanied by a professional interpreter.  Outside records, office note, laboratories, and x-rays have been reviewed.  Patient reports to me many year history of constipation.  She moves her bowels approximately once every 3 days.  Associated with constipation is abdominal bloating.  She also reports a several year history of problems with fleeting rectal discomfort not associated with bowel movements and typically lasting 5 minutes or less.  It is described as a sharp pain or spasm.  Otherwise, no rectal pain.  No bleeding.  Her weight has been stable.  She does have chronic heartburn or indigestion with dietary indiscretion.  She was seen at the internal medicine clinic March 26, 2018.  Rectal examination at that time was unremarkable.  Stool was Hemoccult negative.  She was advised with regards to stool softeners and MiraLAX but has not initiated those recommendations.  There is a questionable history of colitis though this is very uncertain and talking with her today.  Review of blood work from July 2019 finds unremarkable CBC with hemoglobin 12.8.  Conference of metabolic panel unremarkable with normal liver test.  A CT scan of the abdomen and pelvis was performed April 2018 to evaluate upper abdominal pain and nausea.  She was felt possibly to have a early appendicitis.  She subsequently underwent laparoscopic appendectomy.  She also has a history of cholelithiasis on ultrasound and is status post cholecystectomy 2013.  She is anxious to have colonoscopy as recommended by her primary care provider.  No family history of colon cancer reported  REVIEW OF SYSTEMS:  All non-GI ROS negative unless otherwise stated in the HPI  except for seasonal allergies, fatigue, sleeping problems  Past Medical History:  Diagnosis Date  . Anemia   . History of precipitous labor and deliveries, antepartum   . Intrahepatic cholestasis of pregnancy   . Seasonal allergies     Past Surgical History:  Procedure Laterality Date  . CHOLECYSTECTOMY  05/17/2012   Procedure: LAPAROSCOPIC CHOLECYSTECTOMY WITH INTRAOPERATIVE CHOLANGIOGRAM;  Surgeon: Zenovia Jarred, MD;  Location: [facility];  Service: General;  Laterality: N/A;  . GYNECOLOGIC CRYOSURGERY    . LAPAROSCOPIC APPENDECTOMY N/A 10/09/2016   Procedure: APPENDECTOMY LAPAROSCOPIC;  Surgeon: Erroll Luna, MD;  Location: [facility];  Service: General;  Laterality: N/A;    Social History Kimberly Copeland  reports that she has never smoked. She has never used smokeless tobacco. She reports that she does not drink alcohol or use drugs.  family history includes Anemia in her maternal grandmother; Hyperlipidemia in her mother; Peripheral vascular disease in her maternal grandfather, maternal grandmother, and mother.  No Known Allergies     PHYSICAL EXAMINATION: Vital signs: BP 100/80   Pulse 72   Ht 5\' 4"  (1.626 m)   Wt 157 lb 9.6 oz (71.5 kg)   LMP 04/14/2018 (Exact Date)   BMI 27.05 kg/m   Constitutional: generally well-appearing, no acute distress Psychiatric: alert and oriented x3, cooperative Eyes: extraocular movements intact, anicteric, conjunctiva pink Mouth: oral pharynx moist, no lesions Neck: supple no lymphadenopathy Cardiovascular: heart regular rate and rhythm, no murmur Lungs: clear to auscultation bilaterally Abdomen: soft, nontender, nondistended, no obvious ascites, no peritoneal signs, normal bowel sounds, no organomegaly Rectal: Deferred until colonoscopy.  Extremities: no clubbing, cyanosis, or lower extremity edema bilaterally Skin: no lesions on visible extremities Neuro: No focal deficits.  Cranial nerves intact  ASSESSMENT:  1.  Chronic  constipation with abdominal bloating discomfort.  Likely functional 2.  Proctalgia fugax 3.  GERD without alarm features 4.  Status post appendectomy 5.  Status post cholecystectomy  PLAN:  1.  Colonoscopy to evaluate abdominal pain, constipation, and rectal pain.The nature of the procedure, as well as the risks, benefits, and alternatives were carefully and thoroughly reviewed with the patient. Ample time for discussion and questions allowed. The patient understood, was satisfied, and agreed to proceed. 2.  Reflux precautions 3.  Recommended MiraLAX for constipation.  Discussed the appropriate way to titrate  Copy of this consultation has been sent to the patient's PCP

## 2018-04-15 NOTE — Patient Instructions (Signed)

## 2018-04-24 ENCOUNTER — Encounter: Payer: Self-pay | Admitting: Internal Medicine

## 2018-04-24 ENCOUNTER — Ambulatory Visit (AMBULATORY_SURGERY_CENTER): Payer: Self-pay | Admitting: Internal Medicine

## 2018-04-24 VITALS — BP 99/50 | HR 60 | Temp 99.3°F | Resp 12 | Ht 64.0 in | Wt 157.0 lb

## 2018-04-24 DIAGNOSIS — K6289 Other specified diseases of anus and rectum: Secondary | ICD-10-CM

## 2018-04-24 DIAGNOSIS — D122 Benign neoplasm of ascending colon: Secondary | ICD-10-CM

## 2018-04-24 DIAGNOSIS — R140 Abdominal distension (gaseous): Secondary | ICD-10-CM

## 2018-04-24 DIAGNOSIS — K5900 Constipation, unspecified: Secondary | ICD-10-CM

## 2018-04-24 DIAGNOSIS — K635 Polyp of colon: Secondary | ICD-10-CM

## 2018-04-24 MED ORDER — SODIUM CHLORIDE 0.9 % IV SOLN: 500.0000 mL | Freq: Once | INTRAVENOUS | Status: DC

## 2018-04-24 NOTE — Patient Instructions (Signed)
HANDOUT GIVEN FOR POLYPS AND HEMORRHOIDS  YOU HAD AN ENDOSCOPIC PROCEDURE TODAY AT [facility]:   Refer to the procedure report that was given to you for any specific questions about what was found during the examination.  If the procedure report does not answer your questions, please call your gastroenterologist to clarify.  If you requested that your care partner not be given the details of your procedure findings, then the procedure report has been included in a sealed envelope for you to review at your convenience later.  YOU SHOULD EXPECT: Some feelings of bloating in the abdomen. Passage of more gas than usual.  Walking can help get rid of the air that was put into your GI tract during the procedure and reduce the bloating. If you had a lower endoscopy (such as a colonoscopy or flexible sigmoidoscopy) you may notice spotting of blood in your stool or on the toilet paper. If you underwent a bowel prep for your procedure, you may not have a normal bowel movement for a few days.  Please Note:  You might notice some irritation and congestion in your nose or some drainage.  This is from the oxygen used during your procedure.  There is no need for concern and it should clear up in a day or so.  SYMPTOMS TO REPORT IMMEDIATELY:   Following lower endoscopy (colonoscopy or flexible sigmoidoscopy):  Excessive amounts of blood in the stool  Significant tenderness or worsening of abdominal pains  Swelling of the abdomen that is new, acute  Fever of 100F or higher  For urgent or emergent issues, a gastroenterologist can be reached at any hour by calling [phone].   DIET:  We do recommend a small meal at first, but then you may proceed to your regular diet.  Drink plenty of fluids but you should avoid alcoholic beverages for 24 hours.  ACTIVITY:  You should plan to take it easy for the rest of today and you should NOT DRIVE or use heavy machinery until tomorrow (because of the  sedation medicines used during the test).    FOLLOW UP: Our staff will call the number listed on your records the next business day following your procedure to check on you and address any questions or concerns that you may have regarding the information given to you following your procedure. If we do not reach you, we will leave a message.  However, if you are feeling well and you are not experiencing any problems, there is no need to return our call.  We will assume that you have returned to your regular daily activities without incident.  If any biopsies were taken you will be contacted by phone or by letter within the next 1-3 weeks.  Please call us at [phone] if you have not heard about the biopsies in 3 weeks.    SIGNATURES/CONFIDENTIALITY: You and/or your care partner have signed paperwork which will be entered into your electronic medical record.  These signatures attest to the fact that that the information above on your After Visit Summary has been reviewed and is understood.  Full responsibility of the confidentiality of this discharge information lies with you and/or your care-partner.

## 2018-04-24 NOTE — Progress Notes (Signed)
A/ox3 pleased with MAC, report to RN 

## 2018-04-24 NOTE — Op Note (Signed)
[facility] Patient Name: Kimberly Copeland Procedure Date: 04/24/2018 4:17 PM MRN: [id number] Endoscopist: Docia Chuck. [name] , MD Age: 42 Referring MD:  Date of Birth: [birth year] Gender: Female Account #: [id number] Procedure:                Colonoscopy with cold snare polypectomy x 1 Indications:              Abdominal pain, Constipation, rectal pain Medicines:                Monitored Anesthesia Care Procedure:                Pre-Anesthesia Assessment:                           - Prior to the procedure, a History and Physical                            was performed, and patient medications and                            allergies were reviewed. The patient's tolerance of                            previous anesthesia was also reviewed. The risks                            and benefits of the procedure and the sedation                            options and risks were discussed with the patient.                            All questions were answered, and informed consent                            was obtained. Prior Anticoagulants: The patient has                            taken no previous anticoagulant or antiplatelet                            agents. ASA Grade Assessment: II - A patient with                            mild systemic disease. After reviewing the risks                            and benefits, the patient was deemed in                            satisfactory condition to undergo the procedure.                           After obtaining informed consent, the colonoscope  was passed under direct vision. Throughout the                            procedure, the patient's blood pressure, pulse, and                            oxygen saturations were monitored continuously. The                            Colonoscope was introduced through the anus and                            advanced to the the cecum, identified by   appendiceal orifice and ileocecal valve. The                            ileocecal valve, appendiceal orifice, and rectum                            were photographed. The quality of the bowel                            preparation was excellent. The colonoscopy was                            performed without difficulty. The patient tolerated                            the procedure well. The bowel preparation used was                            SUPREP. Scope In: 4:23:05 PM Scope Out: 4:35:28 PM Scope Withdrawal Time: 0 hours 9 minutes 27 seconds  Total Procedure Duration: 0 hours 12 minutes 23 seconds  Findings:                 A 5 mm polyp was found in the ascending colon. The                            polyp was sessile. The polyp was removed with a                            cold snare. Resection and retrieval were complete.                           The exam was otherwise without abnormality on                            direct and retroflexion views. Complications:            No immediate complications. Estimated blood loss:                            None. Estimated Blood Loss:     Estimated blood loss: none. Impression:               -  One 5 mm polyp in the ascending colon, removed                            with a cold snare. Resected and retrieved.                           - The examination was otherwise normal on direct                            and retroflexion views. Recommendation:           - Repeat colonoscopy in 5-10 years for surveillance.                           - Patient has a contact number available for                            emergencies. The signs and symptoms of potential                            delayed complications were discussed with the                            patient. Return to normal activities tomorrow.                            Written discharge instructions were provided to the                            patient.                            - Resume previous diet.                           - Continue present medications.                           - Gardner for constipation                           - Await pathology results. Docia Chuck. [name], MD 04/24/2018 4:41:44 PM This report has been signed electronically.

## 2018-04-24 NOTE — Progress Notes (Signed)
Called to room to assist during endoscopic procedure.  Patient ID and intended procedure confirmed with present staff. Received instructions for my participation in the procedure from the performing physician.  

## 2018-04-25 ENCOUNTER — Telehealth: Payer: Self-pay | Admitting: *Deleted

## 2018-04-25 ENCOUNTER — Telehealth: Payer: Self-pay

## 2018-04-25 NOTE — Telephone Encounter (Signed)
Attempted to reach patient for post-procedure f/u call. No answer. Left message that we will make another attempt to reach her again later today and for her to please not hesitate to call us if she has any questions/concerns regarding her care. 

## 2018-04-25 NOTE — Telephone Encounter (Signed)
No answer, no message left

## 2018-05-01 ENCOUNTER — Encounter: Payer: Self-pay | Admitting: Internal Medicine

## 2018-05-05 ENCOUNTER — Encounter: Payer: Self-pay | Admitting: *Deleted

## 2018-05-08 ENCOUNTER — Ambulatory Visit (INDEPENDENT_AMBULATORY_CARE_PROVIDER_SITE_OTHER): Payer: Self-pay | Admitting: Internal Medicine

## 2018-05-08 ENCOUNTER — Encounter: Payer: Self-pay | Admitting: Internal Medicine

## 2018-05-08 ENCOUNTER — Other Ambulatory Visit: Payer: Self-pay

## 2018-05-08 VITALS — BP 117/42 | HR 60 | Temp 99.3°F | Ht 63.0 in | Wt 160.3 lb

## 2018-05-08 DIAGNOSIS — F325 Major depressive disorder, single episode, in full remission: Secondary | ICD-10-CM

## 2018-05-08 DIAGNOSIS — F32A Depression, unspecified: Secondary | ICD-10-CM

## 2018-05-08 DIAGNOSIS — M546 Pain in thoracic spine: Secondary | ICD-10-CM | POA: Insufficient documentation

## 2018-05-08 DIAGNOSIS — F329 Major depressive disorder, single episode, unspecified: Secondary | ICD-10-CM

## 2018-05-08 DIAGNOSIS — Z8719 Personal history of other diseases of the digestive system: Secondary | ICD-10-CM

## 2018-05-08 DIAGNOSIS — R3915 Urgency of urination: Secondary | ICD-10-CM

## 2018-05-08 DIAGNOSIS — R300 Dysuria: Secondary | ICD-10-CM

## 2018-05-08 DIAGNOSIS — K5900 Constipation, unspecified: Secondary | ICD-10-CM

## 2018-05-08 DIAGNOSIS — G8929 Other chronic pain: Secondary | ICD-10-CM

## 2018-05-08 LAB — POCT URINALYSIS DIPSTICK
Bilirubin, UA: NEGATIVE
Blood, UA: NEGATIVE
Glucose, UA: NEGATIVE
KETONES UA: NEGATIVE
Nitrite, UA: NEGATIVE
PH UA: 6 (ref 5.0–8.0)
Protein, UA: NEGATIVE
Spec Grav, UA: 1.025 (ref 1.010–1.025)
UROBILINOGEN UA: 0.2 U/dL

## 2018-05-08 NOTE — Assessment & Plan Note (Signed)
Pt reports that her constipation has improved since her recent colonoscopy. She uses Miralax occasionally.

## 2018-05-08 NOTE — Patient Instructions (Signed)
Fue un placer conocerla hoy, Sra.  - Lo ms probable es que el dolor de espalda sea causado por una tensin en los msculos de la espalda. No creo que sea una infeccin de la vejiga. Segn los [employer] de sangre recientes, los riones estn sanos.  - Tome ibuprofeno 200mg  cada seis horas segn sea necesario para el dolor  - Use almohadillas de calefaccin en la espalda  - Evite levantar objetos pesados, incluido su hijo, hasta que su dolor mejore - Pruebe algunos estiramientos suaves para Gaffer y Software engineer los msculos de la espalda (se proporciona el folleto)  No dude en llamar a nuestra clnica si sus sntomas empeoran. Harland Dingwall es [phone].  Gracias Dr. Annie Paras  It was a pleasure meeting you today, Ms. Himebaugh.  - Your back pain is most likely caused by a strain to the muscles of the back. I do not think it is a bladder infection. Based on recent blood work, your kidneys are healthy.  - Take ibuprofen 200mg  every six hours as needed for pain  - Use heating pads on your back  - Avoid lifting any heavy objects, including your son, until your pain improves - Try some gentle stretches to loosen and strengthen your back muscles (handout provided)  Feel free to call our clinic if your symptoms worsen. Our number is [phone].  Thanks, Dr. Annie Paras

## 2018-05-08 NOTE — Progress Notes (Signed)
   CC: back pain  HPI:   Kimberly Copeland is a 42 y.o. female with a medical history as listed below who presents to the internal medicine clinic for back pain. Please see problem based charting for the history and status of the patient's current and chronic medical conditions. The history was obtained with the help of a Spanish interpreter.   Past Medical History:  Diagnosis Date  . Anemia   . Depression   . GERD (gastroesophageal reflux disease)   . History of precipitous labor and deliveries, antepartum   . Hyperlipidemia   . Intrahepatic cholestasis of pregnancy   . Seasonal allergies     Review of Systems:   Pertinent positives mentioned in HPI. Remainder of all ROS negative.  Physical Exam: Vitals:   05/08/18 1510  BP: (!) 117/42  Pulse: 60  Temp: 99.3 F (37.4 C)  TempSrc: Oral  SpO2: 100%  Weight: 160 lb 4.8 oz (72.7 kg)  Height: 5\' 3"  (1.6 m)   Physical Exam  Constitutional: Well-developed, well-nourished, and in no distress.  Cardiovascular: Normal rate and regular rhythm. No murmurs, rubs, or gallops. Pulmonary/Chest: Effort normal. Clear to auscultation bilaterally. No wheezes, rales, or rhonchi. Abdominal: Bowel sounds present. Soft, non-distended, non-tender. Back: Bilateral paravertebral thoracic tenderness to palpation. No deformities. Normal ROM. Ext: No lower extremity edema. Skin: Warm and dry. No rashes or wounds.   Assessment & Plan:   See Encounters Tab for problem based charting.  Patient seen with Dr. Eppie Gibson

## 2018-05-08 NOTE — Assessment & Plan Note (Signed)
Pt reports that she took Sertraline for 2 months. She self-discontinued this medication when her mood improved. Since stopping the medication, she has not had recurrence of her depression.

## 2018-05-08 NOTE — Assessment & Plan Note (Addendum)
HPI: Ms. Runnion reports that she has had bilateral thoracic back pain for about 1 year. She is concerned about the pain because she was told by a doctor 7 years ago that she has a kidney issue, but she isn't sure the details of this. She describes the pain as a burning that worsens at night and sometimes wakes her from sleep. Moving around makes the pain better. She has tried tylenol and ibuprofen which provide relief. She denies fevers, chills, abdominal pain, trauma, or strenuous activities. She endorses occasional urinary urgency and dysuria.   Assessment: Physical exam shows bilateral paravertebral thoracic back tenderness without true CVA tenderness. She has no suprapubic tenderness on exam. UA today was negative except for trace leukocytes. Kidney function in 12/2017 was normal, and I do not suspect renal pathology. Based on the chronicity of her back pain, her exam, and her normal urine, her pain is likely musculoskeletal in nature. Will treat conservatively. The patient was advised to f/u if her symptoms worsen.  Plan 1. Ibuprofen 200mg  q6hrs PRN for pain 2. Heating pads 3. Gentle stretches (handout provided in Spanish) 4. Avoid heavy lifting (including son) until pain improves 5. F/u if symptoms do not improve

## 2018-05-13 NOTE — Progress Notes (Signed)
I saw and evaluated the patient.  I personally confirmed the key portions of Dr. Arta Silence history and exam and reviewed pertinent patient test results.  The assessment, diagnosis, and plan were formulated together and I agree with the documentation in the resident's note.

## 2018-06-05 ENCOUNTER — Ambulatory Visit: Payer: Self-pay

## 2018-07-28 ENCOUNTER — Ambulatory Visit
Admission: RE | Admit: 2018-07-28 | Discharge: 2018-07-28 | Disposition: A | Discharge status: Home / Self Care | Payer: No Typology Code available for payment source | Source: Ambulatory Visit | Attending: Obstetrics and Gynecology | Admitting: Obstetrics and Gynecology

## 2018-07-28 DIAGNOSIS — N6489 Other specified disorders of breast: Secondary | ICD-10-CM

## 2018-08-25 ENCOUNTER — Ambulatory Visit: Payer: No Typology Code available for payment source

## 2018-09-08 ENCOUNTER — Other Ambulatory Visit: Payer: Self-pay

## 2018-09-08 ENCOUNTER — Ambulatory Visit: Payer: No Typology Code available for payment source

## 2018-10-16 ENCOUNTER — Encounter: Payer: Self-pay | Admitting: *Deleted

## 2018-12-07 ENCOUNTER — Encounter: Payer: Self-pay | Admitting: *Deleted

## 2018-12-11 ENCOUNTER — Telehealth: Payer: Self-pay | Admitting: Internal Medicine

## 2018-12-11 NOTE — Telephone Encounter (Signed)
Son calling about his mother's swollen and hurting under jaw/neck.  Appt sch for Acc on 12/15/2018. Pt's son is requesting a call back.

## 2018-12-11 NOTE — Telephone Encounter (Signed)
rtc to son, LM for RTC

## 2018-12-12 NOTE — Telephone Encounter (Signed)
LM again for rtc

## 2018-12-15 ENCOUNTER — Ambulatory Visit (INDEPENDENT_AMBULATORY_CARE_PROVIDER_SITE_OTHER): Payer: Self-pay | Admitting: Internal Medicine

## 2018-12-15 ENCOUNTER — Other Ambulatory Visit: Payer: Self-pay

## 2018-12-15 DIAGNOSIS — R221 Localized swelling, mass and lump, neck: Secondary | ICD-10-CM | POA: Insufficient documentation

## 2018-12-15 DIAGNOSIS — J029 Acute pharyngitis, unspecified: Secondary | ICD-10-CM

## 2018-12-15 NOTE — Patient Instructions (Addendum)
Thank you for allowing [place] to care for you  For your neck pain and fullness - This does not appear to be due to any significant infection - Continue to monitor for now and take tylenol as needed - Return to clinic if symptoms fail to improve in the next two weeks

## 2018-12-15 NOTE — Progress Notes (Signed)
   CC: Sore throat, Neck fullness  HPI:    [name] is a 43 y.o. F with PMHx listed below presenting for Sore throat, Neck fullness. Please see the A&P for the status of the patient's chronic medical problems.  Past Medical History:  Diagnosis Date  . Anemia   . Depression   . GERD (gastroesophageal reflux disease)   . History of precipitous labor and deliveries, antepartum   . Hyperlipidemia   . Intrahepatic cholestasis of pregnancy   . Seasonal allergies    Review of Systems:  Performed and all others negative.  Physical Exam: Vitals:   12/15/18 1517  BP: 121/67  Pulse: 67  Temp: 98.6 F (37 C)  TempSrc: Oral  SpO2: 100%  Weight: 161 lb 11.2 oz (73.3 kg)  Height: 5\' 3"  (1.6 m)   Physical Exam Constitutional:      General: She is not in acute distress.    Appearance: Normal appearance.  HENT:     Mouth/Throat:     Mouth: Mucous membranes are moist.     Pharynx: Oropharynx is clear. Uvula midline. No oropharyngeal exudate or posterior oropharyngeal erythema.     Tonsils: No tonsillar exudate.     Comments: Tonsil visible but not significantly enlarged Neck:     Musculoskeletal: Neck supple. No erythema or neck rigidity.     Comments: Mild submandibular fullness and mildly tender to palpation Cardiovascular:     Rate and Rhythm: Normal rate and regular rhythm.     Pulses: Normal pulses.     Heart sounds: Normal heart sounds.  Pulmonary:     Effort: Pulmonary effort is normal. No respiratory distress.     Breath sounds: Normal breath sounds.  Abdominal:     General: Bowel sounds are normal. There is no distension.     Palpations: Abdomen is soft.     Tenderness: There is no abdominal tenderness.  Musculoskeletal:        General: No swelling or deformity.  Lymphadenopathy:     Cervical: No cervical adenopathy.  Skin:    General: Skin is warm and dry.  Neurological:     General: No focal deficit present.     Mental Status: Mental status is at  baseline.    Assessment & Plan:   See Encounters Tab for problem based charting.  Patient discussed with Dr. Rebeca Alert

## 2018-12-15 NOTE — Assessment & Plan Note (Addendum)
Patient presents for evaluation of neck fullness, sore throat and concerns about her tonsils. She has not tried anything for the pain. She has no difficulty swallowing, no pain with swallowing, and no fevers.  She states she has had similar symptoms on and off for over 20 years. She has had worse symptoms including a fever and pain with swallowing, but denies ever being treated with antibiotics. She state she has been told this is due to swelling of her tonsils. She presented today because she typically gets better in about 2 weeks and she has had symptoms for about a months now.  On exam no signs of significant tonsilitis of infection of the submandibular soft tissues. It is possible she has an atypical reaction to viral infections with enlargement of submandibular lyph tissue (despite lack of significant cervical lymphadenopathy). Her symptoms are simlar to chronic intermittent symptoms. Will continue to monitor due to lack of signs or symptoms of severe infection, chronicity of issue, and uninsured status. Will have patient return if she does not improve in the next 2 weeks or so or if she develops worsening symptoms or sign of tonsilitis. - Continue to monitor

## 2018-12-16 NOTE — Progress Notes (Signed)
Internal Medicine Clinic Attending  Case discussed with Dr. Trilby Drummer at the time of the visit.  We reviewed the resident's history and exam and pertinent patient test results.  I agree with the assessment, diagnosis, and plan of care documented in the resident's note.  Here for sore throat and tonsil swelling, which has been recurrent for her throughout her life. However, this episode has lasted longer than usual. Reassuring exam by Dr. Trilby Drummer, no fevers. By [facility] criteria, no need to test for strep throat. Possibly viral infection or allergic rhinitis with postnasal drip. Low suspicion for mono and no intervention would be recommended other than avoiding contact sports. If persists, will need to consider imagine and/or referral to ENT.  Lenice Pressman, M.D., Ph.D.

## 2019-01-09 ENCOUNTER — Other Ambulatory Visit (HOSPITAL_COMMUNITY): Payer: Self-pay | Admitting: *Deleted

## 2019-01-09 DIAGNOSIS — Z1231 Encounter for screening mammogram for malignant neoplasm of breast: Secondary | ICD-10-CM

## 2019-02-24 ENCOUNTER — Encounter: Payer: No Typology Code available for payment source | Admitting: Internal Medicine

## 2019-03-16 ENCOUNTER — Telehealth: Payer: Self-pay | Admitting: *Deleted

## 2019-03-16 ENCOUNTER — Telehealth: Payer: Self-pay

## 2019-03-16 NOTE — Telephone Encounter (Signed)
Also stated vision is blurry

## 2019-03-16 NOTE — Telephone Encounter (Signed)
ERROR

## 2019-03-16 NOTE — Telephone Encounter (Signed)
Noted. Thanks.

## 2019-03-16 NOTE — Telephone Encounter (Signed)
PT REFUSES ED Pt's son calls and states pt has dizziness, weakness, L arm pain, numbness. Advised to go to ED and pt refuses. appt 9/29 [facility]

## 2019-03-17 ENCOUNTER — Encounter: Payer: Self-pay | Admitting: Internal Medicine

## 2019-03-17 ENCOUNTER — Ambulatory Visit (INDEPENDENT_AMBULATORY_CARE_PROVIDER_SITE_OTHER): Payer: Self-pay | Admitting: Internal Medicine

## 2019-03-17 ENCOUNTER — Other Ambulatory Visit: Payer: Self-pay

## 2019-03-17 VITALS — BP 112/68 | HR 77 | Temp 99.5°F | Ht 63.0 in | Wt 160.1 lb

## 2019-03-17 DIAGNOSIS — G933 Postviral fatigue syndrome: Secondary | ICD-10-CM

## 2019-03-17 DIAGNOSIS — U071 COVID-19: Secondary | ICD-10-CM

## 2019-03-17 DIAGNOSIS — R0602 Shortness of breath: Secondary | ICD-10-CM

## 2019-03-17 DIAGNOSIS — R5383 Other fatigue: Secondary | ICD-10-CM

## 2019-03-17 DIAGNOSIS — R439 Unspecified disturbances of smell and taste: Secondary | ICD-10-CM

## 2019-03-17 DIAGNOSIS — G9331 Postviral fatigue syndrome: Secondary | ICD-10-CM

## 2019-03-17 DIAGNOSIS — M791 Myalgia, unspecified site: Secondary | ICD-10-CM

## 2019-03-17 DIAGNOSIS — E785 Hyperlipidemia, unspecified: Secondary | ICD-10-CM

## 2019-03-17 DIAGNOSIS — R42 Dizziness and giddiness: Secondary | ICD-10-CM

## 2019-03-17 LAB — GLUCOSE, CAPILLARY: Glucose-Capillary: 104 mg/dL — ABNORMAL HIGH (ref 70–99)

## 2019-03-17 NOTE — Patient Instructions (Addendum)
  Fue un placer verte hoy! Gracias por elegir [facility] para su atencin primaria.   Hoy hablamos de:  Hoy revisaremos algunos anlisis de laboratorio y lo llamar si algo es anormal. Tambin le hemos enviado una prueba de COVID. [place], [facility], para que le hagan la prueba. Si los resultados son positivos, te Solicitor. La direccin del hospital es: [address], [place], [place] [address].    Llame a la clnica si tiene alguna pregunta o inquietud: [phone]

## 2019-03-17 NOTE — Assessment & Plan Note (Signed)
Given that patient's son was recently febrile with cough and now, Kimberly Copeland is having generalized body myalgias, headache, weakness, and fatigue, the most likely differential is a viral infection.  She does not know of any COVID exposure, but given her symptoms of taste loss, will order COVID test today.    With her shortness of breath, although unclear if true shortness of breath, and fatigue, would like to rule out anemia. No evidence of GI or vaginal bleeding on history. Given dizziness, will rule out electrolyte abnormalities although patient is eating and drinking normally. Orthostatics were negative, unlikely dehydration. CBG was approximately 100, which screens out diabetes.  Plan: - CBC and BMP pending - COVID test pending - Encouraged to increase p.o. intake

## 2019-03-17 NOTE — Progress Notes (Signed)
CC: Acute fatigue, dizziness, muscle aches, SOB  HPI:  [name] is a 43 y.o. with a PMHx of HLD who presents to the clinic for evaluation of acute fatigue, muscle aches, SOB.   [name] reports that 2 weeks ago she began to have generalized myalgias in her upper and lower extremities with a frontal bilateral headache that does not radiate anywhere else.  Denies photophobia.  She also endorses dizziness, generalized weakness and fatigue with some unintentional weight loss of approximately 5 pounds, although she is eating and drinking normally.  Dizziness is worst when standing and get slightly better with sitting and lying down.  It is worsened by moving her eyes.  She endorses shortness of breath that feels like suffocating on her left side, secondary to fatigue and tiredness.  She reports it is not hard to get air in though, but the suffocation feeling is exacerbated by movement.  Symptoms initially progressively worsened but have started to get better in the past 1 to 2 days.  She also endorses some taste loss but denies fever, chills, anosmia, nausea, vomiting, abdominal pain, diarrhea, melena, hematochezia, urinary changes, polyuria, polydipsia.  Her son was recently sick with 2 days of fever and a mild cough that resolved on its own.  Denies any other sick contacts.  Past Medical History:  Diagnosis Date  . Anemia   . Depression   . GERD (gastroesophageal reflux disease)   . History of precipitous labor and deliveries, antepartum   . Hyperlipidemia   . Intrahepatic cholestasis of pregnancy   . Seasonal allergies    Review of Systems: Review of Systems  Constitutional: Positive for malaise/fatigue and weight loss. Negative for chills and fever.  HENT: Negative for congestion and sore throat.   Eyes: Positive for blurred vision (1 years, close up).  Respiratory: Positive for shortness of breath. Negative for cough.   Cardiovascular: Negative for chest pain, palpitations  and leg swelling.  Gastrointestinal: Negative for abdominal pain, blood in stool, constipation, diarrhea, melena, nausea and vomiting.  Genitourinary: Negative for dysuria, frequency, hematuria and urgency.  Musculoskeletal: Positive for myalgias. Negative for back pain, falls, joint pain and neck pain.  Skin: Negative for itching and rash.  Neurological: Positive for dizziness, weakness and headaches. Negative for focal weakness.  Endo/Heme/Allergies: Negative for polydipsia.   Physical Exam:  Vitals:   03/17/19 1524  BP: 112/68  Pulse: 77  Temp: 99.5 F (37.5 C)  TempSrc: Oral  SpO2: 99%  Weight: 160 lb 1.6 oz (72.6 kg)  Height: 5\' 3"  (1.6 m)    Physical Exam Vitals signs and nursing note reviewed.  Constitutional:      General: She is not in acute distress.    Appearance: She is normal weight. She is not ill-appearing or toxic-appearing.  HENT:     Head: Normocephalic and atraumatic.  Eyes:     Extraocular Movements: Extraocular movements intact.     Pupils: Pupils are equal, round, and reactive to light.  Cardiovascular:     Rate and Rhythm: Normal rate and regular rhythm.     Heart sounds: No murmur. No gallop.   Pulmonary:     Effort: Pulmonary effort is normal. No respiratory distress.     Breath sounds: Normal breath sounds. No stridor. No wheezing, rhonchi or rales.  Chest:     Chest wall: No tenderness.  Skin:    General: Skin is warm and dry.  Neurological:     Mental Status: She is alert  and oriented to person, place, and time.     Cranial Nerves: No dysarthria or facial asymmetry.     Motor: No weakness.  Psychiatric:        Mood and Affect: Mood normal.        Behavior: Behavior normal.    Assessment & Plan:   See Encounters Tab for problem based charting.  Patient seen with Dr. Lynnae January

## 2019-03-18 LAB — CBC
Hematocrit: 38.6 % (ref 34.0–46.6)
Hemoglobin: 13.4 g/dL (ref 11.1–15.9)
MCH: 27 pg (ref 26.6–33.0)
MCHC: 34.7 g/dL (ref 31.5–35.7)
MCV: 78 fL — ABNORMAL LOW (ref 79–97)
Platelets: 326 10*3/uL (ref 150–450)
RBC: 4.96 x10E6/uL (ref 3.77–5.28)
RDW: 13.4 % (ref 11.7–15.4)
WBC: 9.3 10*3/uL (ref 3.4–10.8)

## 2019-03-18 LAB — BMP8+ANION GAP
Anion Gap: 16 mmol/L (ref 10.0–18.0)
BUN/Creatinine Ratio: 25 — ABNORMAL HIGH (ref 9–23)
BUN: 15 mg/dL (ref 6–24)
CO2: 21 mmol/L (ref 20–29)
Calcium: 9.5 mg/dL (ref 8.7–10.2)
Chloride: 101 mmol/L (ref 96–106)
Creatinine, Ser: 0.59 mg/dL (ref 0.57–1.00)
GFR calc Af Amer: 132 mL/min/{1.73_m2} (ref >59)
GFR calc non Af Amer: 114 mL/min/{1.73_m2} (ref >59)
Glucose: 92 mg/dL (ref 65–99)
Potassium: 4 mmol/L (ref 3.5–5.2)
Sodium: 138 mmol/L (ref 134–144)

## 2019-03-19 ENCOUNTER — Encounter (HOSPITAL_COMMUNITY): Payer: Self-pay

## 2019-03-19 ENCOUNTER — Other Ambulatory Visit: Payer: Self-pay

## 2019-03-19 ENCOUNTER — Ambulatory Visit (HOSPITAL_COMMUNITY)
Admission: RE | Admit: 2019-03-19 | Discharge: 2019-03-19 | Disposition: A | Discharge status: Home / Self Care | Payer: No Typology Code available for payment source | Source: Ambulatory Visit | Attending: Obstetrics and Gynecology | Admitting: Obstetrics and Gynecology

## 2019-03-19 ENCOUNTER — Ambulatory Visit: Payer: No Typology Code available for payment source

## 2019-03-19 ENCOUNTER — Other Ambulatory Visit: Payer: Self-pay | Admitting: Emergency Medicine

## 2019-03-19 DIAGNOSIS — Z20822 Contact with and (suspected) exposure to covid-19: Secondary | ICD-10-CM

## 2019-03-19 NOTE — Progress Notes (Signed)
Patient visit cancelled due to patients COVID symptoms 03/17/2019 noted in chart and patient has not completed her COVID test. Patient will be rescheduled after completes and receives COVID test results.

## 2019-03-20 LAB — NOVEL CORONAVIRUS, NAA: SARS-CoV-2, NAA: DETECTED — AB

## 2019-03-21 NOTE — Progress Notes (Signed)
Internal Medicine Clinic Attending  I saw and evaluated the patient.  I personally confirmed the key portions of the history and exam documented by Dr. Charleen Kirks and I reviewed pertinent patient test results.  The assessment, diagnosis, and plan were formulated together and I agree with the documentation in the resident's note.   The patient was COVID positive.

## 2019-03-25 ENCOUNTER — Ambulatory Visit: Payer: No Typology Code available for payment source

## 2019-03-30 ENCOUNTER — Telehealth: Payer: Self-pay | Admitting: Internal Medicine

## 2019-03-30 NOTE — Telephone Encounter (Signed)
Rec'd call from patient's son who speaks (English) requesting a call back about  sch appt 04/01/2019.  Please call back.

## 2019-04-01 ENCOUNTER — Ambulatory Visit: Payer: No Typology Code available for payment source

## 2019-04-20 ENCOUNTER — Ambulatory Visit: Payer: No Typology Code available for payment source

## 2019-04-20 ENCOUNTER — Encounter: Payer: Self-pay | Admitting: Internal Medicine

## 2019-04-29 ENCOUNTER — Ambulatory Visit (INDEPENDENT_AMBULATORY_CARE_PROVIDER_SITE_OTHER): Payer: Self-pay | Admitting: Internal Medicine

## 2019-04-29 ENCOUNTER — Encounter: Payer: Self-pay | Admitting: Internal Medicine

## 2019-04-29 DIAGNOSIS — M25521 Pain in right elbow: Secondary | ICD-10-CM

## 2019-04-29 DIAGNOSIS — Z23 Encounter for immunization: Secondary | ICD-10-CM

## 2019-04-29 DIAGNOSIS — M25561 Pain in right knee: Secondary | ICD-10-CM | POA: Insufficient documentation

## 2019-04-29 DIAGNOSIS — Z8619 Personal history of other infectious and parasitic diseases: Secondary | ICD-10-CM

## 2019-04-29 DIAGNOSIS — Z791 Long term (current) use of non-steroidal anti-inflammatories (NSAID): Secondary | ICD-10-CM

## 2019-04-29 MED ORDER — NAPROXEN 500 MG PO TABS: 500.0000 mg | ORAL_TABLET | Freq: Two times a day (BID) | ORAL | 0 refills | Status: AC

## 2019-04-29 NOTE — Patient Instructions (Addendum)
Kimberly Copeland,   Vamos a tratar el anti inflamatorio naproxen para su dolor de Diplomatic Services operational officer. Lo trataremos por 2 semanas. Si el dolor empeoro o si tienes nuevos sintomas dejenos saber.   Haga una cita de seguimiento con nosotros en 2 semanas.   - Dra. Frederico Hamman

## 2019-04-29 NOTE — Assessment & Plan Note (Signed)
Patient presents with a 5- month history of elbow pain and a 3-week history of knee pain.  She reports that the pain is constant in both joints and that Tylenol mildly relieves the pain.  She is a Retail buyer at [employer] and reports having difficulty going up the stairs performing some of her tasks at work.  She has not noticed any swelling or inflammation of her joints.  Ports morning stiffness for about 10 minutes.  Denies infectious symptoms, fatigue, respiratory symptoms, or rashes.  No recent travel.  Did have Covid recently, about 5-6 weeks ago.  No family history of RA or SLE.  Not quite sure that these 2 complaints are related.  Her elbow pain can be from overuse at work as she is a Retail buyer.  Knee pain could be related to prepatellar bursitis versus a reactive arthritis as she had a viral illness recently. Knee US showed subcutaneous inflammation but no knee effusions.  Low suspicion for an underlying rheumatologic disease at this time with normal examination and no other associated sypmtoms.  We will try for course of anti-inflammatories for 2 weeks and reassess.  If no relief in pain recommend knee imaging and rheumatoloic work-up.  - Naproxen 500 mg BID PRN x 2 weeks  - Follow up in 2 weeks

## 2019-04-29 NOTE — Progress Notes (Signed)
    CC: R elbow and knee pain   HPI:  [name] is a 43 y.o. year-old female with PMH listed below who presents to clinic for R elbow and knee pain. Please see problem based assessment and plan for further details.   Past Medical History:  Diagnosis Date  . Anemia   . Depression   . GERD (gastroesophageal reflux disease)   . History of precipitous labor and deliveries, antepartum   . Hyperlipidemia   . Intrahepatic cholestasis of pregnancy   . Seasonal allergies    Review of Systems:   Review of Systems  Constitutional: Negative for chills, fever, malaise/fatigue and weight loss.  Musculoskeletal: Positive for joint pain. Negative for myalgias.  Skin: Negative for rash.  Neurological: Negative for dizziness, tingling, sensory change, weakness and headaches.    Physical Exam:  Vitals:   04/29/19 1427  BP: 123/60  Pulse: (!) 55  Temp: 98.2 F (36.8 C)  TempSrc: Oral  SpO2: 100%  Weight: 160 lb 14.4 oz (73 kg)  Height: 5\' 3"  (1.6 m)    General: well-appearing female in no acute distress  MSK: R elbow with full ROM and no gross deformities noted, no TTP. R knee appears swollen when compared to L. No warmth or effusion noted. Crepitus noted. Full ROM. Overall no synovitis noted.    Assessment & Plan:   See Encounters Tab for problem based charting.  Patient discussed with Dr. Dareen Piano

## 2019-05-04 NOTE — Progress Notes (Signed)
Internal Medicine Clinic Attending  Case discussed with Dr. Santos-Sanchez at the time of the visit.  We reviewed the resident's history and exam and pertinent patient test results.  I agree with the assessment, diagnosis, and plan of care documented in the resident's note.    

## 2019-05-06 ENCOUNTER — Ambulatory Visit: Payer: Self-pay

## 2019-05-06 ENCOUNTER — Other Ambulatory Visit: Payer: Self-pay

## 2019-05-29 ENCOUNTER — Other Ambulatory Visit (HOSPITAL_COMMUNITY): Payer: Self-pay | Admitting: *Deleted

## 2019-05-29 DIAGNOSIS — Z1231 Encounter for screening mammogram for malignant neoplasm of breast: Secondary | ICD-10-CM

## 2019-06-15 ENCOUNTER — Other Ambulatory Visit: Payer: Self-pay

## 2019-06-15 ENCOUNTER — Encounter: Payer: Self-pay | Admitting: Internal Medicine

## 2019-06-15 ENCOUNTER — Ambulatory Visit (HOSPITAL_COMMUNITY)
Admission: RE | Admit: 2019-06-15 | Discharge: 2019-06-15 | Disposition: A | Discharge status: Home / Self Care | Payer: No Typology Code available for payment source | Source: Ambulatory Visit | Attending: Student in an Organized Health Care Education/Training Program | Admitting: Student in an Organized Health Care Education/Training Program

## 2019-06-15 ENCOUNTER — Ambulatory Visit (INDEPENDENT_AMBULATORY_CARE_PROVIDER_SITE_OTHER): Payer: Self-pay | Admitting: Internal Medicine

## 2019-06-15 VITALS — BP 105/58 | HR 71 | Temp 99.3°F | Ht 66.0 in | Wt 167.3 lb

## 2019-06-15 DIAGNOSIS — M7711 Lateral epicondylitis, right elbow: Secondary | ICD-10-CM

## 2019-06-15 DIAGNOSIS — M25561 Pain in right knee: Secondary | ICD-10-CM

## 2019-06-15 DIAGNOSIS — M779 Enthesopathy, unspecified: Secondary | ICD-10-CM | POA: Insufficient documentation

## 2019-06-15 DIAGNOSIS — G8929 Other chronic pain: Secondary | ICD-10-CM | POA: Insufficient documentation

## 2019-06-15 NOTE — Assessment & Plan Note (Signed)
Right knee pain: Right knee pain 3 months. Tylenol has been tried as well as massaging the affected area. She denied trauma to either joint. She denied rash, or asthma. She endorsed a rash as a child with. She also endorses the knee pain while going down stairs. She must take stairs one at a time using her left leg due to the right knee pain.  The pain is worse while kneeling and worse with prolonged periods of inactivity.  There is a popping/clicking sensation and deep achiness of the affected joint.  She has had this clicking/popping sensation for than 5 years now. NO FHx of arthritic conditions.  I am most concerned for meniscal injury at this time vs osteoarthritis.   Plan: Plain films of the right knee NSAIDS as above

## 2019-06-15 NOTE — Assessment & Plan Note (Signed)
Tendinitis: Patient presents today with symptoms concerning for tendinitis near the lateral epicondyle possibly involving the right tricep versus the supinator.  This has been ongoing for least 8 months.  She was recently given a course of naproxen 500 milligrams twice daily for 2 weeks which did not alleviate her symptoms.  However, she did continue to use the elbow intermittently.  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 The right elbow pain is most noticeable with use of her right hand and gripping objects for prolonged periods of time.  On exam today there is tenderness to palpation just proximal to the right lateral epicondyle at the point of insertion just proximal to the right elbow.  There is no palpable deformity.  Strength is intact.  There is an increase in intensity of the pain with supination of the right hand but not with pronation.  Plan:  Ibuprofen 80 mg 3 times daily for 7 days Soft elbow brace primarily to encourage the patient to decrease use of this arm fully aggravating motions

## 2019-06-15 NOTE — Progress Notes (Signed)
   CC: elbow and knee pain  HPI:Kimberly Copeland is a 43 y.o. female who presents for evaluation of elbow and knee pain. Please see individual problem based A/P for details.  Past Medical History:  Diagnosis Date  . Anemia   . Depression   . GERD (gastroesophageal reflux disease)   . History of precipitous labor and deliveries, antepartum   . Hyperlipidemia   . Intrahepatic cholestasis of pregnancy   . Seasonal allergies    Review of Systems:   ROS negative except as per HPI.  Physical Exam: Vitals:   06/15/19 1330  BP: (!) 105/58  Pulse: 71  Temp: 99.3 F (37.4 C)  TempSrc: Oral  SpO2: 99%  Weight: 167 lb 4.8 oz (75.9 kg)  Height: 5\' 6"  (1.676 m)   General: A/O x4, in no acute distress, afebrile, nondiaphoretic HEENT: PEERL, EMO intact Cardio: RRR, no mrg's  Pulmonary: CTA bilaterally, no wheezing or crackles  Abdomen: Bowel sounds normal, soft, nontender  MSK: BLE nontender, nonedematous, there is mild pain with palpation of the knee diffusely, no edema or erythema noted, no crepitus on exam, strength is 5/5 bilaterally, sensation is intact to the distal lower extremity. Lachmans and anterior draw test negative.  Psych: Appropriate affect, not depressed in appearance, engages well  Assessment & Plan:   See Encounters Tab for problem based charting.  Patient discussed with Dr. Evette Doffing

## 2019-06-15 NOTE — Patient Instructions (Signed)
FOLLOW-UP INSTRUCTIONS When: As needed What to bring: All of your medications  For your right elbow pain, you will need to completely stop the activity that exacerbates this condition.  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  Additionally, I would recommend ibuprofen 800 mg 3 times daily for 7 to 10 days.  You may also purchase a soft right elbow brace which may help remind you to avoid using that arm.  For your right knee pain we will obtain x-rays today and notify you of the results.  Thank you for your visit to the [facility] today. If you have any questions or concerns please call us at [phone].   INSTRUCCIONES DE SEGUIMIENTO Cundo: segn sea necesario Qu traer: todos sus medicamentos  Para el dolor en el codo derecho, deber detener por completo la actividad que agrava esta afeccin. Deber dejar de usar su brazo derecho para Engineer, civil (consulting). Adems, recomendara ibuprofeno 800 mg 3 veces al da durante 7 a 10 das. Tambin puede comprar una abrazadera suave para el codo derecho que puede ayudarlo a recordar que debe evitar usar ese brazo.  Para su dolor de rodilla derecha, obtendremos radiografas hoy y [facility].  Gracias por su visita al [facility] hoy. Si tiene alguna pregunta o inquietud, llmenos al [phone].

## 2019-06-16 NOTE — Progress Notes (Signed)
Internal Medicine Clinic Attending  Case discussed with Dr. Harbrecht at the time of the visit.  We reviewed the resident's history and exam and pertinent patient test results.  I agree with the assessment, diagnosis, and plan of care documented in the resident's note.   

## 2019-06-17 ENCOUNTER — Telehealth: Payer: Self-pay | Admitting: Internal Medicine

## 2019-06-17 NOTE — Telephone Encounter (Signed)
An interpreter# [id number] was used to translate.  However, upon attempting to contact the patient she did not answer and I was disconnected.   I am recommending additional strength training exercises that are nonweight bearing such as quadricep extensions and hamstring curls, water aerobics or exercises, NSAIDs as needed and a light walking routine for her osteoarthritis as noted on the plain films from 06-15-2019

## 2019-07-21 ENCOUNTER — Other Ambulatory Visit (HOSPITAL_COMMUNITY): Payer: Self-pay | Admitting: *Deleted

## 2019-07-21 ENCOUNTER — Other Ambulatory Visit: Payer: Self-pay

## 2019-07-21 ENCOUNTER — Encounter (HOSPITAL_COMMUNITY): Payer: Self-pay

## 2019-07-21 ENCOUNTER — Ambulatory Visit (HOSPITAL_COMMUNITY)
Admission: RE | Admit: 2019-07-21 | Discharge: 2019-07-21 | Disposition: A | Discharge status: Home / Self Care | Payer: No Typology Code available for payment source | Source: Ambulatory Visit | Attending: Obstetrics and Gynecology | Admitting: Obstetrics and Gynecology

## 2019-07-21 ENCOUNTER — Ambulatory Visit: Payer: No Typology Code available for payment source

## 2019-07-21 DIAGNOSIS — Z1239 Encounter for other screening for malignant neoplasm of breast: Secondary | ICD-10-CM | POA: Insufficient documentation

## 2019-07-21 DIAGNOSIS — N644 Mastodynia: Secondary | ICD-10-CM

## 2019-07-21 NOTE — Progress Notes (Signed)
Complaints of left outer breast pain x 3 months. Patient rates the pain at a 6 out of 10.  Pap Smear: Pap smear not completed today. Last Pap smear was 02/22/2015 at the [facility] and normal. Per patient has no history of an abnormal Pap smear. Patient refused Pap smear today. Last Pap smear result is in Epic.  Physical exam: Breasts Breasts symmetrical. No skin abnormalities bilateral breasts. No nipple retraction bilateral breasts. No nipple discharge bilateral breasts. No lymphadenopathy. No lumps palpated bilateral breasts. Complaints of left outer breast pain on exam. Referred patient to the [place] for a diagnostic mammogram. Appointment scheduled for Monday, July 27, 2019 at 1500.        Pelvic/Bimanual No Pap smear completed today since patient refused.  Smoking History: Patient has never smoked.  Patient Navigation: Patient education provided. Access to services provided for patient through [facility] program. Spanish interpreter provided.   Breast and Cervical Cancer Risk Assessment: Patient has no family history of breast cancer, known genetic mutations, or radiation treatment to the chest before age 93. Patient has no history of cervical dysplasia, immunocompromised, or DES exposure in-utero.  Risk Assessment    Risk Scores      07/21/2019 01/09/2018   Last edited by: Demetrius Revel, LPN Kellin Fifer, Heath Gold, RN   5-year risk: 0.3 % 0.3 %   Lifetime risk: 5.1 % 5.2 %         Used Spanish interpreter Rudene Anda from [employer].

## 2019-07-21 NOTE — Patient Instructions (Signed)
Explained breast self awareness with Kimberly Copeland. Patient is due for a Pap smear. Patient refused Pap smear today. Patient scheduled an appointment to come to the free cervical cancer screening on 11/23/2019 at 1300. Let her know [facility] will cover Pap smears every 3 years unless has a history of abnormal Pap smears. Referred patient to the [place] for a diagnostic mammogram. Appointment scheduled for Monday, July 27, 2019 at 1500. Patient aware of appointments and will be there. Milnor verbalized understanding.  Assunta Pupo, Arvil Chaco, RN 10:42 AM

## 2019-07-27 ENCOUNTER — Other Ambulatory Visit: Payer: Self-pay

## 2019-07-27 ENCOUNTER — Ambulatory Visit
Admission: RE | Admit: 2019-07-27 | Discharge: 2019-07-27 | Disposition: A | Discharge status: Home / Self Care | Payer: No Typology Code available for payment source | Source: Ambulatory Visit | Attending: Obstetrics and Gynecology | Admitting: Obstetrics and Gynecology

## 2019-07-27 DIAGNOSIS — N644 Mastodynia: Secondary | ICD-10-CM

## 2019-09-21 ENCOUNTER — Emergency Department (HOSPITAL_COMMUNITY)
Admission: EM | Admit: 2019-09-21 | Discharge: 2019-09-22 | Disposition: A | Discharge status: Home / Self Care | Payer: No Typology Code available for payment source | Attending: Emergency Medicine | Admitting: Emergency Medicine

## 2019-09-21 ENCOUNTER — Encounter (HOSPITAL_COMMUNITY): Payer: Self-pay | Admitting: Emergency Medicine

## 2019-09-21 ENCOUNTER — Other Ambulatory Visit: Payer: Self-pay

## 2019-09-21 ENCOUNTER — Telehealth: Payer: Self-pay | Admitting: *Deleted

## 2019-09-21 ENCOUNTER — Emergency Department (HOSPITAL_COMMUNITY): Payer: No Typology Code available for payment source

## 2019-09-21 DIAGNOSIS — Z79899 Other long term (current) drug therapy: Secondary | ICD-10-CM | POA: Insufficient documentation

## 2019-09-21 DIAGNOSIS — Z20822 Contact with and (suspected) exposure to covid-19: Secondary | ICD-10-CM | POA: Insufficient documentation

## 2019-09-21 DIAGNOSIS — J302 Other seasonal allergic rhinitis: Secondary | ICD-10-CM | POA: Insufficient documentation

## 2019-09-21 DIAGNOSIS — Z975 Presence of (intrauterine) contraceptive device: Secondary | ICD-10-CM | POA: Insufficient documentation

## 2019-09-21 DIAGNOSIS — J4521 Mild intermittent asthma with (acute) exacerbation: Secondary | ICD-10-CM

## 2019-09-21 LAB — BASIC METABOLIC PANEL
Anion gap: 8 (ref 5–15)
BUN: 17 mg/dL (ref 6–20)
CO2: 21 mmol/L — ABNORMAL LOW (ref 22–32)
Calcium: 8.5 mg/dL — ABNORMAL LOW (ref 8.9–10.3)
Chloride: 104 mmol/L (ref 98–111)
Creatinine, Ser: 0.84 mg/dL (ref 0.44–1.00)
GFR calc Af Amer: >60 mL/min (ref >60)
GFR calc non Af Amer: >60 mL/min (ref >60)
Glucose, Bld: 104 mg/dL — ABNORMAL HIGH (ref 70–99)
Potassium: 4 mmol/L (ref 3.5–5.1)
Sodium: 133 mmol/L — ABNORMAL LOW (ref 135–145)

## 2019-09-21 LAB — CBC
HCT: 41.2 % (ref 36.0–46.0)
Hemoglobin: 13.3 g/dL (ref 12.0–15.0)
MCH: 27.5 pg (ref 26.0–34.0)
MCHC: 32.3 g/dL (ref 30.0–36.0)
MCV: 85.3 fL (ref 80.0–100.0)
Platelets: 308 10*3/uL (ref 150–400)
RBC: 4.83 MIL/uL (ref 3.87–5.11)
RDW: 13.2 % (ref 11.5–15.5)
WBC: 9.8 10*3/uL (ref 4.0–10.5)
nRBC: 0 % (ref 0.0–0.2)

## 2019-09-21 LAB — TROPONIN I (HIGH SENSITIVITY)
Troponin I (High Sensitivity): 3 ng/L (ref <18)
Troponin I (High Sensitivity): 3 ng/L (ref <18)

## 2019-09-21 LAB — I-STAT BETA HCG BLOOD, ED (MC, WL, AP ONLY): I-stat hCG, quantitative: <5 m[IU]/mL (ref <5)

## 2019-09-21 NOTE — ED Triage Notes (Signed)
Using interpreter, pt reports chest tightness and congestion, and SOB since Friday. Symptoms were gone Saturday and Sunday, then started back up today at work.

## 2019-09-21 NOTE — Telephone Encounter (Signed)
Thank you. I agree that patient should be evaluated in ED.

## 2019-09-21 NOTE — Telephone Encounter (Signed)
Richardean Sale, patient's son, called to make appt but was transferred to Triage. Son states patient is having "a hard time breathing," and "has pain in the chest." Son instructed to take patient directly to ED. States he will. Hubbard Hartshorn, BSN, RN-BC

## 2019-09-22 LAB — SARS CORONAVIRUS 2 (TAT 6-24 HRS): SARS Coronavirus 2: NEGATIVE

## 2019-09-22 MED ORDER — AEROCHAMBER PLUS FLO-VU MISC
1.0000 | Freq: Once | Status: AC
  Administered 2019-09-22: 01:00:00 1
  Filled 2019-09-22: qty 1

## 2019-09-22 MED ORDER — DEXAMETHASONE SODIUM PHOSPHATE 10 MG/ML IJ SOLN
10.0000 mg | Freq: Once | INTRAMUSCULAR | Status: AC
  Administered 2019-09-22: 10 mg via INTRAMUSCULAR
  Filled 2019-09-22: qty 1

## 2019-09-22 MED ORDER — ALBUTEROL SULFATE HFA 108 (90 BASE) MCG/ACT IN AERS
8.0000 | INHALATION_SPRAY | Freq: Once | RESPIRATORY_TRACT | Status: AC
  Administered 2019-09-22: 8 via RESPIRATORY_TRACT
  Filled 2019-09-22: qty 6.7

## 2019-09-22 NOTE — ED Provider Notes (Signed)
[facility] EMERGENCY DEPARTMENT Provider Note   CSN: [id number] Arrival date & time: 09/21/19  1701     History Chief Complaint  Patient presents with  . Chest Pain    Kimberly Copeland is a 44 y.o. female   The history is provided by the patient. A language interpreter was used ([place]).  Shortness of Breath Onset quality:  Sudden Duration:  1 day Timing:  Constant Progression:  Waxing and waning Chronicity:  New Context: activity, known allergens, pollens and weather changes   Context: not animal exposure, not emotional upset, not fumes, not occupational exposure, not smoke exposure, not strong odors and not URI   Relieved by:  None tried Worsened by:  Coughing Ineffective treatments:  None tried Associated symptoms: cough, ear pain, sore throat and wheezing   Associated symptoms: no abdominal pain, no chest pain, no claudication, no diaphoresis, no fever, no headaches, no hemoptysis, no neck pain, no PND, no rash, no sputum production, no syncope, no swollen glands and no vomiting   Risk factors: no hx of PE/DVT        Past Medical History:  Diagnosis Date  . Anemia   . Depression   . GERD (gastroesophageal reflux disease)   . History of precipitous labor and deliveries, antepartum   . Hyperlipidemia   . Intrahepatic cholestasis of pregnancy   . Seasonal allergies     Patient Active Problem List   Diagnosis Date Noted  . Screening breast examination 07/21/2019  . Breast pain, left 07/21/2019  . Tendinitis 06/15/2019  . Knee pain, right 04/29/2019  . ERRONEOUS ENCOUNTER--DISREGARD 03/19/2019  . Fatigue 03/17/2019  . Neck fullness 12/15/2018  . Acute bilateral thoracic back pain 05/08/2018  . Constipation 03/26/2018  . 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 Dental Pain 03/05/2018  . Depression 02/04/2018  . Hyperlipidemia 02/04/2018  . Gastroesophageal reflux disease without esophagitis 02/04/2018  . IUD contraception 02/25/2017  . Cervical  polyp 11/16/2016    Past Surgical History:  Procedure Laterality Date  . CHOLECYSTECTOMY  05/17/2012   Procedure: LAPAROSCOPIC CHOLECYSTECTOMY WITH INTRAOPERATIVE CHOLANGIOGRAM;  Surgeon: Zenovia Jarred, MD;  Location: [facility];  Service: General;  Laterality: N/A;  . GYNECOLOGIC CRYOSURGERY    . LAPAROSCOPIC APPENDECTOMY N/A 10/09/2016   Procedure: APPENDECTOMY LAPAROSCOPIC;  Surgeon: Erroll Luna, MD;  Location: [facility];  Service: General;  Laterality: N/A;     OB History    Gravida  5   Para  4   Term  4   Preterm      AB  1   Living  4     SAB  1   TAB      Ectopic      Multiple      Live Births  4           Family History  Problem Relation Age of Onset  . Hyperlipidemia Mother   . Peripheral vascular disease Maternal Grandmother   . Anemia Maternal Grandmother   . Peripheral vascular disease Maternal Grandfather   . Colon cancer Neg Hx   . Colon polyps Neg Hx   . Esophageal cancer Neg Hx   . Rectal cancer Neg Hx   . Stomach cancer Neg Hx     Social History   Tobacco Use  . Smoking status: Never Smoker  . Smokeless tobacco: Never Used  Substance Use Topics  . Alcohol use: No  . Drug use: No    Home Medications Prior to Admission medications  Medication Sig Start Date End Date Taking? Authorizing Provider  calcium-vitamin D 250-100 MG-UNIT tablet Take 1 tablet by mouth 2 (two) times daily.    [provider]  Multiple Vitamins-Minerals (MULTIVITAMIN WITH MINERALS) tablet Take 1 tablet by mouth daily.    [provider]    Allergies    Patient has no known allergies.  Review of Systems   Review of Systems  Constitutional: Negative for activity change, chills, diaphoresis and fever.  HENT: Positive for congestion, ear pain, rhinorrhea and sore throat. Negative for drooling, ear discharge, sinus pressure, sinus pain, trouble swallowing and voice change.        No loss of sense of taste or smell Pruritus to the bilateral  ears and nose  Eyes: Positive for discharge (tearing) and itching. Negative for photophobia, pain, redness and visual disturbance.  Respiratory: Positive for cough, chest tightness, shortness of breath and wheezing. Negative for hemoptysis, sputum production, choking and stridor.   Cardiovascular: Negative for chest pain, palpitations, claudication, leg swelling, syncope and PND.  Gastrointestinal: Negative for abdominal pain, constipation, diarrhea, nausea and vomiting.  Genitourinary: Negative for dysuria.  Musculoskeletal: Negative for back pain and neck pain.  Skin: Negative for rash.  Allergic/Immunologic: Negative for immunocompromised state.  Neurological: Negative for seizures, syncope, speech difficulty, weakness and headaches.  Psychiatric/Behavioral: Negative for confusion.    Physical Exam Updated Vital Signs BP 115/64 (BP Location: Right Arm)   Pulse 63   Temp 98.4 F (36.9 C) (Oral)   Resp 15   Ht 5\' 3"  (1.6 m)   LMP 08/26/2019 (Approximate)   SpO2 100%   BMI 29.23 kg/m   Physical Exam Vitals and nursing note reviewed.  Constitutional:      General: She is not in acute distress.    Appearance: She is not ill-appearing, toxic-appearing or diaphoretic.  HENT:     Head: Normocephalic.     Right Ear: Hearing, tympanic membrane and ear canal normal.     Left Ear: Hearing, tympanic membrane and ear canal normal.     Nose: Congestion present.     Right Turbinates: Pale.     Left Turbinates: Pale.     Right Sinus: No maxillary sinus tenderness or frontal sinus tenderness.     Left Sinus: No maxillary sinus tenderness or frontal sinus tenderness.     Mouth/Throat:     Mouth: Mucous membranes are moist.     Pharynx: Oropharynx is clear. Uvula midline. No pharyngeal swelling, oropharyngeal exudate, posterior oropharyngeal erythema or uvula swelling.  Eyes:     General: No scleral icterus.    Extraocular Movements: Extraocular movements intact.     Conjunctiva/sclera:  Conjunctivae normal.     Pupils: Pupils are equal, round, and reactive to light.  Cardiovascular:     Rate and Rhythm: Normal rate and regular rhythm.     Heart sounds: No murmur. No friction rub. No gallop.   Pulmonary:     Effort: Pulmonary effort is normal. No respiratory distress.     Breath sounds: No stridor. Wheezing present. No rhonchi or rales.     Comments: Breath sounds are mildly diminished bilaterally.  There is faint end expiratory wheezes in the bilateral bases.  No rhonchi or rales.  No accessory muscle use or retractions.  Patient is able to speak in complete, fluent sentences without increased work of breathing. Chest:     Chest wall: No tenderness.  Abdominal:     General: There is no distension.  Palpations: Abdomen is soft.     Tenderness: There is no abdominal tenderness. There is no right CVA tenderness, left CVA tenderness, guarding or rebound.     Hernia: No hernia is present.  Musculoskeletal:     Cervical back: Neck supple.  Skin:    General: Skin is warm.     Findings: No rash.  Neurological:     Mental Status: She is alert.  Psychiatric:        Behavior: Behavior normal.     ED Results / Procedures / Treatments   Labs (all labs ordered are listed, but only abnormal results are displayed) Labs Reviewed  BASIC METABOLIC PANEL - Abnormal; Notable for the following components:      Result Value   Sodium 133 (*)    CO2 21 (*)    Glucose, Bld 104 (*)    Calcium 8.5 (*)    All other components within normal limits  SARS CORONAVIRUS 2 (TAT 6-24 HRS)  CBC  I-STAT BETA HCG BLOOD, ED ([facility], [facility], [facility] ONLY)  TROPONIN I (HIGH SENSITIVITY)  TROPONIN I (HIGH SENSITIVITY)    EKG EKG Interpretation  Date/Time:  Monday September 21 2019 17:08:41 EDT Ventricular Rate:  77 PR Interval:  146 QRS Duration: 70 QT Interval:  372 QTC Calculation: 420 R Axis:   83 Text Interpretation: Normal sinus rhythm Nonspecific T wave abnormality Abnormal ECG No significant  change since last tracing in July 2019 Confirmed by Pryor Curia [phone]) on 09/22/2019 12:55:53 AM   Radiology DG Chest 2 View  Result Date: 09/21/2019 CLINICAL DATA:  44 year old female with shortness of breath. EXAM: CHEST - 2 VIEW COMPARISON:  Chest radiograph dated 10/17/2016. FINDINGS: The lungs are clear. There is no pleural effusion or pneumothorax. The cardiac silhouette is within limits. No acute osseous pathology. IMPRESSION: No active cardiopulmonary disease. Electronically Signed   By: Anner Crete M.D.   On: 09/21/2019 18:06    Procedures Procedures (including critical care time)  Medications Ordered in ED Medications  dexamethasone (DECADRON) injection 10 mg (10 mg Intramuscular Given 09/22/19 0111)  albuterol (VENTOLIN HFA) 108 (90 Base) MCG/ACT inhaler 8 puff (8 puffs Inhalation Given 09/22/19 0111)  aerochamber plus with mask device 1 each (1 each Other Given 09/22/19 0110)    ED Course  I have reviewed the triage vital signs and the nursing notes.  Pertinent labs & imaging results that were available during my care of the patient were reviewed by me and considered in my medical decision making (see chart for details).    MDM Rules/Calculators/A&P                      44 year old female with a history of seasonal allergies, HLD, GERD, anemia, and depression who presents to the emergency department with a chief complaint of shortness of breath, chest tightness, nonproductive cough, and intermittent wheezing, onset today.  Reports that she has been having nasal congestion, rhinorrhea, sneezing, and itchy scratchy throat as well as itching to her bilateral ears and nose for the last 3 days.  She reports that symptoms are worse when she is outside and around the pollen.  She has a history of seasonal allergies and reports that symptoms feel similar.  She has been taking an over-the-counter antihistamine with initial improvement for the first 2 days, but no provement since.   Reports that she previously had an albuterol inhaler at home, but has not had one in years.  She denies fever, chills,  chest pain, leg swelling, palpitations, nausea, vomiting, diarrhea, loss of sense of taste or smell, abdominal pain, back pain, dizziness, lightheadedness. She previously had a positive COVID-19 test on 03/20/19.  No known sick contacts.  She denies a formal diagnosis of asthma, but has had similar symptoms previously associated with seasonal allergies.  Reports symptoms significantly improved in the past with "a shot in her arm" and an inhaler.  No other treatment prior to arrival.  On exam, vital signs are within normal limits.  She has no respiratory distress or increased work of breathing.  Lung sounds are mildly diminished throughout and there is bilateral end expiratory wheezes in the bases.    Also discussed with the patient that she had COVID-19 more than 3 months ago and symptoms with seasonal allergies and COVID-19 do overlap.  She is agreeable to retesting for COVID-19.  EKG with sinus rhythm.  Labs ordered by triage are reassuring.  Chest x-ray has been reviewed by me without acute findings.  No evidence of pneumonia. Will treat with albuterol inhaler and spacer for mild dyspnea and wheezing.  Will give injection of Decadron for the same.  I have a low suspicion for ACS, PE, bacterial pneumonia, aortic dissection, CHF.  Discussed with the patient that COVID-19 test will be pending.  She will receive a call from the hospital if it is positive.  She was encouraged to check the results in MyChart and will not receive a phone call if the test is negative.  Following albuterol administration, she feels much improved.  On reevaluation, air movement is much improved.  She has scattered end expiratory wheezes bilaterally.  She has no hypoxia or increased work of breathing.  At this time, the patient is hemodynamically stable and in no acute distress.  Will discharge to home with  albuterol as well as OTC symptomatic treatment.  ER return precautions given.  Final Clinical Impression(s) / ED Diagnoses Final diagnoses:  Mild intermittent reactive airway disease with wheezing with acute exacerbation  Seasonal allergies    Rx / DC Orders ED Discharge Orders    None       Aida Lemaire A, PA-C 09/22/19 0708    Ward, Delice Bison, DO 09/22/19 0725

## 2019-09-22 NOTE — Discharge Instructions (Addendum)
Gracias por permitirme at[facility] [employer] [facility].  Para sibilancias, episodios de tos, opresin en el pecho, dificultad para respirar, puede usar 2 inhalaciones del inhalador de albuterol con un espaciador cada 4 horas segn sea necesario.  Puede usar un antihistamnico de venta Otis, como Zyrtec o Claritin, como se indica en la etiqueta, para ayudar con sus sntomas. Flonase tambin es un aerosol nasal de venta libre que puede ayudar con la picazn, el goteo y la congestin nasal. Tambin hay varias gotas para los ojos que ayudarn con las alergias que estn disponibles sin [employer]. Benadryl tambin est disponible sin receta y puede ayudar con la picazn, pero tenga cuidado con este medicamento ya que puede causarle somnolencia.  Tiene una prueba COVID-19 pendiente. Si la prueba es positiva, recibir una llamada del hospital. Si la prueba es positiva, debe ponerse en cuarentena durante un total de 10 das desde que comenzaron sus sntomas.  Regrese a la sala de emergencias si presenta dificultad respiratoria, fiebre alta con dificultad para respirar, si se desmaya, presenta mareos intensos o aturdimiento, entumecimiento o debilidad nuevos u otros sntomas nuevos relacionados.  Thank you for allowing me to care for you today in the Emergency Department.   For wheezing, coughing episodes, chest tightness, shortness of breath, you may use 2 puffs of the albuterol inhaler with a spacer every 4 hours as needed.  You may use an antihistamine over-the-counter, such as Zyrtec or Claritin as directed on the label help with your symptoms.  Flonase is also a nose spray that is available over-the-counter that can help with an itchy, runny, congested nose.  There are also several eyedrops that will help with allergies that are available over-the-counter.  Benadryl is also available over-the-counter and can help with itching, but use caution with this medicine as it can make you  drowsy.  You have a pending COVID-19 test.  If the test is positive, you will receive a call from the hospital.  If the test is positive, you should quarantine for a total of 10 days from when your symptoms began.   Return to the emergency department if you develop respiratory distress, high fevers with shortness of breath, if you pass out, develop severe dizziness or lightheadedness, new numbness or weakness, or other new, concerning symptoms.

## 2019-11-23 ENCOUNTER — Other Ambulatory Visit: Payer: Self-pay | Admitting: *Deleted

## 2019-11-23 ENCOUNTER — Other Ambulatory Visit: Payer: Self-pay

## 2019-11-23 DIAGNOSIS — Z124 Encounter for screening for malignant neoplasm of cervix: Secondary | ICD-10-CM

## 2019-11-23 NOTE — Addendum Note (Signed)
Addended by: Demetrius Revel on: 11/23/2019 04:16 PM   Modules accepted: Orders

## 2019-11-23 NOTE — Progress Notes (Signed)
Patient: Kimberly Copeland           Date of Birth: 11-12-1975           MRN: [id number] Visit Date: 11/23/2019 PCP: Harvie Heck, MD  Cervical Cancer Screening Do you smoke?: No Have you ever had or been told you have an allergy to latex products?: No Marital status: Single Date of last pap smear: 2-5 yrs ago Date of last menstrual period: 11/22/19 Number of pregnancies: 5 Number of births: 4 Have you ever had any of the following? Hysterectomy: No Tubal ligation (tubes tied): No Abnormal bleeding: No Abnormal pap smear: No Venereal warts: No A sex partner with venereal warts: No A high risk* sex partner: No  Cervical Exam  Abnormal Observations: Blood in vagina and on cervix consistent with patients menstrual period.  Recommendations: Last Pap smear was 02/22/2015 at the [facility] and normal. Per patient has no history of an abnormal Pap smear. Last Pap smear result is in Epic. If today's Pap smear is normal next Pap smear is due in 3 years.      Used Spanish interpreter [employer] from [place].   Patient's History Patient Active Problem List   Diagnosis Date Noted  . Screening breast examination 07/21/2019  . Breast pain, left 07/21/2019  . Tendinitis 06/15/2019  . Knee pain, right 04/29/2019  . ERRONEOUS ENCOUNTER--DISREGARD 03/19/2019  . Fatigue 03/17/2019  . Neck fullness 12/15/2018  . Acute bilateral thoracic back pain 05/08/2018  . Constipation 03/26/2018  . 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 Dental Pain 03/05/2018  . Depression 02/04/2018  . Hyperlipidemia 02/04/2018  . Gastroesophageal reflux disease without esophagitis 02/04/2018  . IUD contraception 02/25/2017  . Cervical polyp 11/16/2016   Past Medical History:  Diagnosis Date  . Anemia   . Depression   . GERD (gastroesophageal reflux disease)   . History of precipitous labor and deliveries, antepartum   . Hyperlipidemia   . Intrahepatic cholestasis of pregnancy   .  Seasonal allergies     Family History  Problem Relation Age of Onset  . Hyperlipidemia Mother   . Peripheral vascular disease Maternal Grandmother   . Anemia Maternal Grandmother   . Peripheral vascular disease Maternal Grandfather   . Colon cancer Neg Hx   . Colon polyps Neg Hx   . Esophageal cancer Neg Hx   . Rectal cancer Neg Hx   . Stomach cancer Neg Hx     Social History   Occupational History  . Not on file  Tobacco Use  . Smoking status: Never Smoker  . Smokeless tobacco: Never Used  Substance and Sexual Activity  . Alcohol use: No  . Drug use: No  . Sexual activity: Not Currently    Birth control/protection: Implant

## 2019-11-24 LAB — CYTOLOGY - PAP: Diagnosis: NEGATIVE

## 2019-11-30 ENCOUNTER — Telehealth: Payer: Self-pay

## 2019-11-30 NOTE — Telephone Encounter (Signed)
(  11/25/2019) Via Kimberly Copeland, [employer] Interpreter, Patient informed negative pap results, due in 3 years. Patient verbalized understanding.

## 2020-01-21 ENCOUNTER — Encounter: Payer: Self-pay | Admitting: Internal Medicine

## 2020-01-21 ENCOUNTER — Other Ambulatory Visit: Payer: Self-pay

## 2020-01-21 ENCOUNTER — Ambulatory Visit (INDEPENDENT_AMBULATORY_CARE_PROVIDER_SITE_OTHER): Payer: Self-pay | Admitting: Internal Medicine

## 2020-01-21 VITALS — BP 113/62 | HR 92 | Temp 99.0°F | Ht 64.0 in | Wt 164.9 lb

## 2020-01-21 DIAGNOSIS — M25521 Pain in right elbow: Secondary | ICD-10-CM

## 2020-01-21 DIAGNOSIS — M2550 Pain in unspecified joint: Secondary | ICD-10-CM

## 2020-01-21 DIAGNOSIS — M25561 Pain in right knee: Secondary | ICD-10-CM

## 2020-01-21 DIAGNOSIS — M25562 Pain in left knee: Secondary | ICD-10-CM

## 2020-01-21 DIAGNOSIS — M25531 Pain in right wrist: Secondary | ICD-10-CM

## 2020-01-21 DIAGNOSIS — M25532 Pain in left wrist: Secondary | ICD-10-CM

## 2020-01-21 NOTE — Progress Notes (Addendum)
   CC: Multiple joint pains   HPI:  Kimberly Copeland is a 44 y.o. with a PMHx of  who presents to the clinic for multiple joint pain.   Please see the Encounters tab for problem-based Assessment & Plan regarding status of patient's acute and chronic conditions.  Past Medical History:  Diagnosis Date  . Anemia   . Depression   . GERD (gastroesophageal reflux disease)   . History of precipitous labor and deliveries, antepartum   . Hyperlipidemia   . Intrahepatic cholestasis of pregnancy   . Seasonal allergies    Review of Systems: Review of Systems  Constitutional: Negative for chills, fever, malaise/fatigue and weight loss.  Respiratory: Negative for shortness of breath.   Cardiovascular: Negative for chest pain and palpitations.  Musculoskeletal: Positive for joint pain. Negative for falls.  Neurological: Negative for focal weakness and weakness.    Physical Exam:  Vitals:   01/21/20 1458 01/21/20 1502  BP:  113/62  Pulse:  92  Temp:  99 F (37.2 C)  TempSrc:  Oral  SpO2:  98%  Weight: 164 lb 14.4 oz (74.8 kg)   Height: 5\' 4"  (1.626 m)     Physical Exam Vitals and nursing note reviewed.  Constitutional:      General: She is not in acute distress.    Appearance: She is normal weight.  Musculoskeletal:     Comments: Right elbow with significant tenderness to the lateral aspect without any overlying erythema or underlying mass. Bilateral knee pain that is tender to palpation throughout without any particular localization.  Both knees have mild crepitus.  No laxity noted on examination.  Range of motion was full bilaterally Bilateral wrists with mild tenderness to palpation throughout.  Full range of motion.  Skin:    General: Skin is warm and dry.     Findings: No erythema or rash.  Neurological:     General: No focal deficit present.     Mental Status: She is alert and oriented to person, place, and time.  Psychiatric:        Mood and Affect: Mood normal.         Behavior: Behavior normal.      Procedure Note  Indication:  Bilateral knee pain  Operators: Drs Roma Kayser  The patient was provided with risks, benefits, and alternatives to intraarticular injection. He/She consented to bilateral intraarticular knee injection for bilateral knee pain.  After a time out was preformed, the knee was prepped in a sterile fashion. A mixture of 1 cc 1% lidocaine and 40 mg of Kenalog was injected into each knee using a 25 gauge needle.  The left knee space was entered without difficulty, entered successfully after 0 attempts. The right knee space was entered without difficulty, entered successfully after 0 attempts. The patient tolerated the procedures well without complication.    Assessment & Plan:   See Encounters Tab for problem based charting.  Patient seen with Dr. Philipp Ovens

## 2020-01-21 NOTE — Patient Instructions (Addendum)
It was nice seeing you today! Thank you for choosing [facility] for your Primary Care.    Today we talked about:   1. Today, we injected steroid with numbing medicine into both your knees. Additionally, we are checking blood work to rule out autoimmune causes of your joint pains.    ------------------------------------------------  Alm Bustard un placer verte hoy! Gracias por elegir [facility] para su atencin primaria.   Hoy hablamos de:  Hoy, le inyectamos esteroides con un medicamento anestsico en ambas rodillas. Adems, estamos controlando los anlisis de sangre para Paramedic causas autoinmunes de los dolores articulares.  Te llamar con [employer]

## 2020-01-22 LAB — RHEUMATOID FACTOR: Rheumatoid fact SerPl-aCnc: <10 IU/mL (ref 0.0–13.9)

## 2020-01-22 LAB — C-REACTIVE PROTEIN: CRP: 3 mg/L (ref 0–10)

## 2020-01-22 LAB — ANTINUCLEAR ANTIBODIES, IFA: ANA Titer 1: NEGATIVE

## 2020-01-22 LAB — SEDIMENTATION RATE: Sed Rate: 47 mm/hr — ABNORMAL HIGH (ref 0–32)

## 2020-01-23 LAB — CYCLIC CITRUL PEPTIDE ANTIBODY, IGG/IGA: Cyclic Citrullin Peptide Ab: 4 units (ref 0–19)

## 2020-01-25 DIAGNOSIS — M2550 Pain in unspecified joint: Secondary | ICD-10-CM | POA: Insufficient documentation

## 2020-01-25 NOTE — Assessment & Plan Note (Addendum)
Ms. Giacobbe states that her first joint that gave her difficulty was her right elbow approximately 1.5 years ago and soon after she developed bilateral knee pain.  At the time, she did not have any trauma to any of those regions.  She visited the clinic and elbow pain was felt to be due to overuse and knee pain due to possible osteoarthritis.  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  Conservative management was recommended with NSAIDs and Tylenol.  Ms. Rew states that she has been doing this but only provides minimal relief.  Pain has persisted but is stable in nature without changes in presentation.  She states approximately 3 to 4 months ago though, her bilateral wrists began to hurt especially with twisting motions.  She notes the pain in her joints is present throughout the day.  She denies any morning stiffness.  She denies any current systemic symptoms including fever, chills, weight loss, rash.  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  Assessment/plan: Differential certainly includes overuse injury given that patient works daily in the [employer] and is having to work on her knees, and use her wrists and elbows for wiping.  However it is unusual that she has been in this job for 14 years, and develops these numerous joint pains in the last year and a half.  It is also unusual that now more numerous large joints are involved.   Due to this we will work up for possible rheumatological or autoimmune causes.    ESR and CRP were obtained, CRP was within normal limits but ESR was mildly elevated at 47.  ANA, anti-CCP, and rheumatoid factor was negative.   -ESR, CRP, RF, anti-CCP, ANA obtained -Bilateral steroid knee injections -Continue NSAIDs as needed

## 2020-01-27 NOTE — Progress Notes (Signed)
Internal Medicine Clinic Attending  I saw and evaluated the patient.  I personally confirmed the key portions of the history and exam documented by Dr. Charleen Kirks and I reviewed pertinent patient test results.  The assessment, diagnosis, and plan were formulated together and I agree with the documentation in the resident's note.   I was present for the procedure.

## 2020-02-15 ENCOUNTER — Other Ambulatory Visit: Payer: Self-pay

## 2020-02-15 ENCOUNTER — Ambulatory Visit: Payer: No Typology Code available for payment source

## 2020-02-18 ENCOUNTER — Encounter (HOSPITAL_COMMUNITY): Payer: Self-pay

## 2020-02-18 ENCOUNTER — Emergency Department (HOSPITAL_COMMUNITY)
Admission: EM | Admit: 2020-02-18 | Discharge: 2020-02-18 | Disposition: A | Discharge status: Home / Self Care | Payer: No Typology Code available for payment source | Attending: Emergency Medicine | Admitting: Emergency Medicine

## 2020-02-18 ENCOUNTER — Other Ambulatory Visit: Payer: Self-pay

## 2020-02-18 ENCOUNTER — Emergency Department (HOSPITAL_COMMUNITY): Payer: No Typology Code available for payment source

## 2020-02-18 DIAGNOSIS — Y999 Unspecified external cause status: Secondary | ICD-10-CM | POA: Insufficient documentation

## 2020-02-18 DIAGNOSIS — S0990XA Unspecified injury of head, initial encounter: Secondary | ICD-10-CM | POA: Diagnosis present

## 2020-02-18 DIAGNOSIS — Y92410 Unspecified street and highway as the place of occurrence of the external cause: Secondary | ICD-10-CM | POA: Diagnosis not present

## 2020-02-18 DIAGNOSIS — M542 Cervicalgia: Secondary | ICD-10-CM | POA: Diagnosis not present

## 2020-02-18 DIAGNOSIS — S161XXA Strain of muscle, fascia and tendon at neck level, initial encounter: Secondary | ICD-10-CM

## 2020-02-18 DIAGNOSIS — S0993XA Unspecified injury of face, initial encounter: Secondary | ICD-10-CM

## 2020-02-18 DIAGNOSIS — Y9389 Activity, other specified: Secondary | ICD-10-CM | POA: Diagnosis not present

## 2020-02-18 DIAGNOSIS — Z041 Encounter for examination and observation following transport accident: Secondary | ICD-10-CM | POA: Diagnosis not present

## 2020-02-18 DIAGNOSIS — S0081XA Abrasion of other part of head, initial encounter: Secondary | ICD-10-CM | POA: Diagnosis not present

## 2020-02-18 MED ORDER — ACETAMINOPHEN 325 MG PO TABS
650.0000 mg | ORAL_TABLET | Freq: Once | ORAL | Status: AC
  Administered 2020-02-18: 650 mg via ORAL
  Filled 2020-02-18: qty 2

## 2020-02-18 MED ORDER — DICLOFENAC SODIUM 1 % EX GEL: 2.0000 g | Freq: Four times a day (QID) | CUTANEOUS | 0 refills | Status: AC | PRN

## 2020-02-18 MED ORDER — ONDANSETRON HCL 4 MG PO TABS
4.0000 mg | ORAL_TABLET | Freq: Once | ORAL | Status: AC
  Administered 2020-02-18: 4 mg via ORAL
  Filled 2020-02-18: qty 1
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
## 2020-08-11 ENCOUNTER — Other Ambulatory Visit: Payer: Self-pay | Admitting: Obstetrics and Gynecology

## 2020-08-11 DIAGNOSIS — Z1231 Encounter for screening mammogram for malignant neoplasm of breast: Secondary | ICD-10-CM

## 2020-08-23 ENCOUNTER — Ambulatory Visit: Payer: Self-pay | Admitting: Student

## 2020-08-23 ENCOUNTER — Encounter: Payer: Self-pay | Admitting: Student

## 2020-08-23 ENCOUNTER — Other Ambulatory Visit: Payer: Self-pay

## 2020-08-23 VITALS — BP 127/70 | HR 78 | Temp 98.7°F | Ht 63.0 in | Wt 165.4 lb

## 2020-08-23 DIAGNOSIS — E785 Hyperlipidemia, unspecified: Secondary | ICD-10-CM

## 2020-08-23 DIAGNOSIS — R5383 Other fatigue: Secondary | ICD-10-CM

## 2020-08-23 NOTE — Progress Notes (Signed)
   CC: fatigue  HPI:  Kimberly Copeland is a 45 y.o. with GERD, hyperlipidemia presenting to [facility] for complaints of fatigue.  Please see problem-based list for further details, assessments, and plans.  Past Medical History:  Diagnosis Date  . Depression   . GERD (gastroesophageal reflux disease)   . History of precipitous labor and deliveries, antepartum   . Hyperlipidemia   . Intrahepatic cholestasis of pregnancy   . Seasonal allergies    Review of Systems:  As per HPI  Physical Exam:  Vitals:   08/23/20 1436  BP: 127/70  Pulse: 78  Temp: 98.7 F (37.1 C)  TempSrc: Oral  SpO2: 97%  Weight: 165 lb 6.4 oz (75 kg)  Height: 5\' 3"  (1.6 m)   General: Non-toxic, non-diaphoretic. No acute distress CV: Regular rate, rhythm. No murmurs, rubs, gallops Pulm: Normal WOB. Clear to auscultation bilaterally. MSK: Normal bulk, tone. No pitting edema bilaterally.  Assessment & Plan:   See Encounters Tab for problem based charting.  Patient seen with Dr. Rebeca Alert

## 2020-08-23 NOTE — Patient Instructions (Addendum)
Kimberly Copeland,  Me alegre de verle hoy!  Hoy hablamos de sus sntomas de fatiga y dolores musculares. Parece que esto podra ser una infeccin. Te he tomado muestras de COVID-19 y gripe. Te llamar con [employer] Si es negativo, le pediremos que regrese para realizarle ms pruebas.  Estamos deseando verle la proxima vez. [employer] a [phone] si tiene preguntas o preocupaciones. El mejor tiempo para llamar es lunes a viernes, 9:00am - 4:00pm, pero hay alguien esta a su disposcion para lo que halga falta a cualquier hora. Si necesita recambio de los [employer] , llama su farmacia una semana antes de fecha de finalizacion de la receta. La farmacia nos llamara para la solicitud.  Gracias que nos permite tomar parte en su asistencia medica. Deseamos lo mejor!  Clayburn Pert, Dr. Sanjuan Dame, MD

## 2020-08-23 NOTE — Assessment & Plan Note (Addendum)
Patient presents with one week's history of fatigue. Patient says she has constantly felt tired and has been sleeping a lot. During this time she endorses myalgias and intermittent headaches. Kimberly Copeland has had joint pains in the past, but mentions these pains have felt different. She does have some chronic dyspnea, but reports no acute changes. She denies fevers, chills, rhinorrhea, sore throat, nausea, vomiting, abdominal pain, diarrhea. No changes in diet, denies sick contacts. She has not been vaccinated for COVID-19 or influenza.  In addition, she denies any skin or hair changes. Also denies heat or cold intolerance. Reports LMP ended 2 days ago, reports normal flow. No hx of trauma, injury, or obvious bleeding.  A/P: History most consistent with acute infection causing fatigue. On exam she is afebrile, appears non-toxic. Cannot rule out thyroid disorder or anemia. Last TSH in 2019 normal. Will obtain COVID-19 and influenza tests today. Will have her follow-up for further lab work if both negative. - Pending COVID-19, influenza tests - If negative, check BMP, CBC, TSH - Encourage COVID-19 vaccinations  ADDENDUM: Called patient with negative COVID, influenza results. Patient mentions she is feeling a little better but continues to be fatigued. Will have her come for further lab work tomorrow. - BMP, CBC, TSH ordered

## 2020-08-25 LAB — COVID-19, FLU A+B NAA
Influenza A, NAA: NOT DETECTED
Influenza B, NAA: NOT DETECTED
SARS-CoV-2, NAA: NOT DETECTED

## 2020-08-25 NOTE — Addendum Note (Signed)
Addended bySanjuan Dame on: 08/25/2020 11:28 AM   Modules accepted: Orders

## 2020-08-26 ENCOUNTER — Other Ambulatory Visit (INDEPENDENT_AMBULATORY_CARE_PROVIDER_SITE_OTHER): Payer: Self-pay

## 2020-08-26 DIAGNOSIS — E785 Hyperlipidemia, unspecified: Secondary | ICD-10-CM

## 2020-08-26 DIAGNOSIS — R5383 Other fatigue: Secondary | ICD-10-CM

## 2020-08-26 NOTE — Progress Notes (Signed)
Internal Medicine Clinic Attending  Case discussed with Dr. Braswell at the time of the visit.  We reviewed the resident's history and exam and pertinent patient test results.  I agree with the assessment, diagnosis, and plan of care documented in the resident's note.  Alexander Raines, M.D., Ph.D.  

## 2020-08-27 LAB — LIPID PANEL
Chol/HDL Ratio: 6.3 ratio — ABNORMAL HIGH (ref 0.0–4.4)
Cholesterol, Total: 257 mg/dL — ABNORMAL HIGH (ref 100–199)
HDL: 41 mg/dL (ref >39)
LDL Chol Calc (NIH): 162 mg/dL — ABNORMAL HIGH (ref 0–99)
Triglycerides: 290 mg/dL — ABNORMAL HIGH (ref 0–149)
VLDL Cholesterol Cal: 54 mg/dL — ABNORMAL HIGH (ref 5–40)

## 2020-08-27 LAB — CBC
Hematocrit: 40.9 % (ref 34.0–46.6)
Hemoglobin: 13.5 g/dL (ref 11.1–15.9)
MCH: 27.3 pg (ref 26.6–33.0)
MCHC: 33 g/dL (ref 31.5–35.7)
MCV: 83 fL (ref 79–97)
Platelets: 326 10*3/uL (ref 150–450)
RBC: 4.94 x10E6/uL (ref 3.77–5.28)
RDW: 13.1 % (ref 11.7–15.4)
WBC: 8.5 10*3/uL (ref 3.4–10.8)

## 2020-08-27 LAB — BMP8+ANION GAP
Anion Gap: 18 mmol/L (ref 10.0–18.0)
BUN/Creatinine Ratio: 17 (ref 9–23)
BUN: 12 mg/dL (ref 6–24)
CO2: 21 mmol/L (ref 20–29)
Calcium: 9.8 mg/dL (ref 8.7–10.2)
Chloride: 100 mmol/L (ref 96–106)
Creatinine, Ser: 0.71 mg/dL (ref 0.57–1.00)
Glucose: 90 mg/dL (ref 65–99)
Potassium: 4.2 mmol/L (ref 3.5–5.2)
Sodium: 139 mmol/L (ref 134–144)
eGFR: 108 mL/min/{1.73_m2} (ref >59)

## 2020-08-27 LAB — TSH: TSH: 2.72 u[IU]/mL (ref 0.450–4.500)

## 2020-08-30 ENCOUNTER — Other Ambulatory Visit: Payer: Self-pay | Admitting: Student

## 2020-08-30 DIAGNOSIS — M2550 Pain in unspecified joint: Secondary | ICD-10-CM

## 2020-08-30 MED ORDER — NAPROXEN 500 MG PO TABS: 500.0000 mg | ORAL_TABLET | Freq: Two times a day (BID) | ORAL | 0 refills | Status: AC

## 2020-08-30 NOTE — Progress Notes (Signed)
Called and discussed results with patient. ASCVD 10-year risk 1.8%. Patient reports minor improvement of symptoms, reports polyarthralgias. Will prescribe naproxen BID and have her return to clinic if symptoms persist.

## 2020-09-22 ENCOUNTER — Ambulatory Visit: Payer: Self-pay | Admitting: *Deleted

## 2020-09-22 ENCOUNTER — Other Ambulatory Visit: Payer: Self-pay

## 2020-09-22 ENCOUNTER — Encounter (INDEPENDENT_AMBULATORY_CARE_PROVIDER_SITE_OTHER): Payer: Self-pay

## 2020-09-22 VITALS — BP 114/74 | Wt 161.8 lb

## 2020-09-22 DIAGNOSIS — Z1239 Encounter for other screening for malignant neoplasm of breast: Secondary | ICD-10-CM

## 2020-09-22 NOTE — Patient Instructions (Signed)
Explained breast self awareness with Loreli Dollar Brion. Patient did not need a Pap smear today due to last Pap smear was 11/23/2019. Let her know BCCCP will cover Pap smears every 3 years unless has a history of abnormal Pap smears. Referred patient to the [facility] for a screening mammogram on the mobile unit. Appointment scheduled Tuesday, October 04, 2020 at 1300. Patient aware of appointment and will be there. Let patient know the [facility] will follow up with her within a couple weeks following appointment with results. [place] verbalized understanding.  Ahmaud Duthie, Arvil Chaco, RN 9:16 AM

## 2020-09-22 NOTE — Progress Notes (Signed)
[name] is a 45 y.o. female who presents to [facility] clinic today with no complaints.    Pap Smear: Pap smear not completed today. Last Pap smear was 11/23/2019 at the free cervical cancer screening clinic at the [facility] and was normal. Per patient has history of an abnormal Pap smear 20 years ago that cryotherapy was completed for follow-up. Per patient all Pap smears have been normal since cryotherapy she has had at least three normal Pap smears. Last Pap smear result is available in Epic.   Physical exam: Breasts Breasts symmetrical. No skin abnormalities bilateral breasts. No nipple retraction bilateral breasts. No nipple discharge bilateral breasts. No lymphadenopathy. No lumps palpated bilateral breasts. Complaints of left outer breast tenderness on exam. Patients previous exam on 07/21/2019 she complained of left outer breast tenderness and a diagnostic mammogram was completed for follow-up 07/27/2019 that was negative.       MM DIAG BREAST TOMO UNI LEFT  Result Date: 07/28/2018 CLINICAL DATA:  45 year old patient presents for follow-up of the left breast for evaluation a probably benign asymmetry in the upper left breast identified on her baseline screening mammogram performed in July 2019. EXAM: DIGITAL DIAGNOSTIC UNILATERAL LEFT MAMMOGRAM WITH CAD AND TOMO COMPARISON:  January 09, 2018 and January 20, 2018 ACR Breast Density Category c: The breast tissue is heterogeneously dense, which may obscure small masses. FINDINGS: Previously identified asymmetry is not apparent on today's mammogram. No mass, asymmetry, or suspicious microcalcification is identified in the left breast to suggest malignancy. Mammographic images were processed with CAD. IMPRESSION: No evidence of malignancy in the left breast. RECOMMENDATION: Bilateral screening mammogram is recommended in July 2020. I have discussed the findings and recommendations with the patient. Results were also provided in  writing at the conclusion of the visit. If applicable, a reminder letter will be sent to the patient regarding the next appointment. BI-RADS CATEGORY  1: Negative. Electronically Signed   By: Curlene Dolphin M.D.   On: 07/28/2018 14:47   MM DIAG BREAST TOMO UNI LEFT  Result Date: 01/20/2018 CLINICAL DATA:  Screening recall from baseline for an asymmetry in the left breast. EXAM: DIGITAL DIAGNOSTIC LEFT MAMMOGRAM WITH CAD AND TOMO ULTRASOUND LEFT BREAST COMPARISON:  Baseline screening mammogram 01/09/2018. ACR Breast Density Category c: The breast tissue is heterogeneously dense, which may obscure small masses. FINDINGS: There is a persistent asymmetry in the upper slightly inner quadrant of the left breast, far posterior depth. No suspicious associated distortion or calcifications are identified. Mammographic images were processed with CAD. On physical exam, no suspicious palpable masses are identified on exam of the superior left breast. Targeted ultrasound is performed, showing normal fibroglandular tissue in the superior left breast. No masses or suspicious areas of shadowing are identified. IMPRESSION: There is a persistent asymmetry in the superior left breast without a sonographic correlate. This is a probably benign finding. RECOMMENDATION: Six-month follow-up left breast mammogram is recommended. I have discussed the findings and recommendations with the patient. Results were also provided in writing at the conclusion of the visit. If applicable, a reminder letter will be sent to the patient regarding the next appointment. BI-RADS CATEGORY  3: Probably benign. Electronically Signed   By: Ammie Ferrier M.D.   On: 01/20/2018 15:04   MS DIGITAL SCREENING TOMO BILATERAL  Result Date: 01/10/2018 CLINICAL DATA:  Screening. EXAM: DIGITAL SCREENING BILATERAL MAMMOGRAM WITH TOMO AND CAD COMPARISON:  None. ACR Breast Density Category c: The breast tissue is heterogeneously dense,  which may obscure small  masses. FINDINGS: In the left breast, a possible asymmetry warrants further evaluation. In the right breast, no findings suspicious for malignancy. Images were processed with CAD. IMPRESSION: Further evaluation is suggested for possible asymmetry in the left breast. RECOMMENDATION: Diagnostic mammogram and possibly ultrasound of the left breast. (Code:[id number]) The patient will be contacted regarding the findings, and additional imaging will be scheduled. BI-RADS CATEGORY  0: Incomplete. Need additional imaging evaluation and/or prior mammograms for comparison. Electronically Signed   By: Kristopher Oppenheim M.D.   On: 01/10/2018 12:18   MS DIGITAL DIAG TOMO BILAT  Result Date: 07/27/2019 CLINICAL DATA:  Focal tenderness in the outer left breast past 4 months. No known injury. EXAM: DIGITAL DIAGNOSTIC BILATERAL MAMMOGRAM WITH CAD AND TOMO ULTRASOUND LEFT BREAST COMPARISON:  Previous exam(s). ACR Breast Density Category c: The breast tissue is heterogeneously dense, which may obscure small masses. FINDINGS: Stable mammographic appearance of the breasts with no interval findings suspicious for malignancy. Mammographic images were processed with CAD. Targeted ultrasound is performed, showing normal appearing breast tissue in the outer left breast at the location of focal tenderness reported by the patient. IMPRESSION: No evidence of malignancy. RECOMMENDATION: Bilateral screening mammogram in 1 year. I have discussed the findings and recommendations with the patient. If applicable, a reminder letter will be sent to the patient regarding the next appointment. BI-RADS CATEGORY  1: Negative. Electronically Signed   By: Claudie Revering M.D.   On: 07/27/2019 15:55   Pelvic/Bimanual Pap is not indicated today per BCCCP guidelines.   Smoking History: Patient has never smoked.   Patient Navigation: Patient education provided. Access to services provided for patient through [facility] program. Spanish interpreter Rudene Anda from [facility] provided.   Breast and Cervical Cancer Risk Assessment: Patient does not have family history of breast cancer, known genetic mutations, or radiation treatment to the chest before age 89. Per patient has history of cervical dysplasia. Patient has no history of being immunocompromised or DES exposure in-utero.  Risk Assessment    Risk Scores      09/22/2020 07/21/2019   Last edited by: Demetrius Revel, LPN McG[place], Sherie Mamie Nick, LPN   5-year risk: 0.4 % 0.3 %   Lifetime risk: 5 % 5.1 %          A: BCCCP exam without pap smear No complaints.   P: Referred patient to the [facility] for a screening mammogram on the mobile unit. Appointment scheduled Tuesday, October 04, 2020 at 1300.  Loletta Parish, RN 09/22/2020 9:15 AM

## 2020-10-04 ENCOUNTER — Ambulatory Visit
Admission: RE | Admit: 2020-10-04 | Discharge: 2020-10-04 | Disposition: A | Discharge status: Home / Self Care | Payer: No Typology Code available for payment source | Source: Ambulatory Visit | Attending: Obstetrics and Gynecology | Admitting: Obstetrics and Gynecology

## 2020-10-04 ENCOUNTER — Other Ambulatory Visit: Payer: Self-pay

## 2020-10-04 DIAGNOSIS — Z1231 Encounter for screening mammogram for malignant neoplasm of breast: Secondary | ICD-10-CM

## 2021-11-15 ENCOUNTER — Ambulatory Visit: Payer: Self-pay | Admitting: Internal Medicine

## 2021-11-15 ENCOUNTER — Encounter: Payer: Self-pay | Admitting: Internal Medicine

## 2021-11-15 DIAGNOSIS — G8929 Other chronic pain: Secondary | ICD-10-CM

## 2021-11-15 DIAGNOSIS — M25562 Pain in left knee: Secondary | ICD-10-CM | POA: Insufficient documentation

## 2021-11-15 DIAGNOSIS — R2231 Localized swelling, mass and lump, right upper limb: Secondary | ICD-10-CM | POA: Insufficient documentation

## 2021-11-15 NOTE — Assessment & Plan Note (Addendum)
Patient presents to the clinic with concerns for a right sided axillary mass that has slowly been growing over the past 2 years. Patient states that she had a similar mass over 20 years ago when she was in [place] that was so large she could not close her arm all the way. This prior mass was taken out, but there was no pathology report, per the patient. She states that the mass is mildly tender the more it has grown. She denies any recent infections, cat bites/scratches, weight loss, or fatigue.   A/P: Patient presents to clinic with R axillary mass with insidious onset over the past two years. On further examination she has a soft mass approximately 4 cm in diameter in the R axilla. When examined under U/S there is a heterogeneic mass consistent with lipoma. Patient was reassured. No further work up at this time.  - Lipoma sheet provided to patient.

## 2021-11-15 NOTE — Assessment & Plan Note (Signed)
Patient with Hx of arthritis presents today with complaints of chronic L knee pain. She states that several years ago she was able to receive bilateral knee injections, which greatly alleviates her pain. She states that the pain is minimally alleviated with tylenol and ibuprofen. 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 On examination, she has an unremarkable knee examination. Due to lack of staff, will need a knee injection at her next visit.  - L Knee injection at next visit.

## 2021-11-15 NOTE — Progress Notes (Signed)
   CC: Right axillary mass, left knee pain  HPI:  Kimberly Copeland is a 46 y.o. person, with a PMH noted below, who presents to the clinic for evaluation of right axillary mass as well as left knee pain. To see the management of their acute and chronic conditions, please see the A&P note under the Encounters tab.   Past Medical History:  Diagnosis Date   Depression    GERD (gastroesophageal reflux disease)    History of precipitous labor and deliveries, antepartum    Hyperlipidemia    Intrahepatic cholestasis of pregnancy    Seasonal allergies    Review of Systems:   Review of Systems  Constitutional:  Negative for chills, fever, malaise/fatigue and weight loss.    Physical Exam:  Vitals:   11/15/21 0947  BP: (!) 119/52  Pulse: 74  Temp: 98.5 F (36.9 C)  TempSrc: Oral  SpO2: 99%  Weight: 163 lb 12.8 oz (74.3 kg)  Height: '5\' 3"'$  (1.6 m)   Physical Exam Constitutional:      General: She is not in acute distress.    Appearance: Normal appearance. She is not ill-appearing, toxic-appearing or diaphoretic.  HENT:     Head: Normocephalic and atraumatic.  Chest:     Chest wall: Mass present. No lacerations, deformity, swelling, tenderness, crepitus or edema.       Comments: Soft, non fluctuant mass approximately 4 cm across in the R axilla. No erythema, purulence, or drainage noted. Mildly tender to palpation. No lymphadenopathy. Musculoskeletal:        General: Normal range of motion.     Cervical back: Normal range of motion and neck supple. No rigidity or tenderness.     Right lower leg: No edema.     Left lower leg: No edema.     Comments: L knee with full ROM, negative anterior/posterior draw, no valgus/varus laxity noted, no effusion present  Lymphadenopathy:     Cervical: No cervical adenopathy.  Skin:    General: Skin is warm.     Findings: No bruising, erythema or lesion.  Neurological:     Mental Status: She is alert.     Assessment & Plan:   See  Encounters Tab for problem based charting.  Patient seen with Dr. Evette Doffing  No problem-specific Assessment & Plan notes found for this encounter.

## 2021-11-15 NOTE — Patient Instructions (Addendum)
To Ms. Charette,   It was a pleasure seeing you today! Today we discussed your armpit mass and knee pain.   For your armpit mass, on imaging it appears to be what we call a lipoma, which is a mass of fat. It is benign. There are no swollen lymph nodes or infection present.   For your knee pain, we will need to schedule you with a physician who can do a knee injection.   We will find a provider who can assist with a knee injection and have you come back then.  Have a good day,  Maudie Mercury, MD  A la Sra. [place],   Fue un placer verte hoy! Hoy discutimos su masa axilar y dolor de [place].   Para la masa de la axila, en las imgenes parece ser lo que llamamos un lipoma, que es una masa de [place]. Es benigno. No hay ganglios linfticos inflamados o infeccin presente.   Para su dolor de rodilla, necesitaremos programarlo con un mdico que pueda hacer una inyeccin de rodilla.   Encontraremos un proveedor que pueda ayudarlo con una inyeccin de rodilla y hacer que regrese entonces.  Ten un buen da  Dr. Maudie Mercury

## 2021-11-16 NOTE — Progress Notes (Signed)
Internal Medicine Clinic Attending  I saw and evaluated the patient.  I personally confirmed the key portions of the history and exam documented by Dr. Gilford Rile and I reviewed pertinent patient test results.  The assessment, diagnosis, and plan were formulated together and I agree with the documentation in the resident's note.   Both physical exam and POCUS of the right axilla show a mass consistent with a lipoma. No features of fluid filled abscess or hydradenitis, no adenopathy detected, it feels too soft to be a malignant mass. Will watch over time, but seems low risk.

## 2021-12-26 ENCOUNTER — Other Ambulatory Visit: Payer: Self-pay

## 2021-12-26 ENCOUNTER — Ambulatory Visit (INDEPENDENT_AMBULATORY_CARE_PROVIDER_SITE_OTHER): Payer: Self-pay | Admitting: Student

## 2021-12-26 ENCOUNTER — Encounter: Payer: Self-pay | Admitting: Student

## 2021-12-26 VITALS — BP 118/65 | HR 80 | Temp 98.4°F | Ht 66.0 in | Wt 166.0 lb

## 2021-12-26 DIAGNOSIS — G8929 Other chronic pain: Secondary | ICD-10-CM

## 2021-12-26 DIAGNOSIS — M25562 Pain in left knee: Secondary | ICD-10-CM

## 2021-12-26 DIAGNOSIS — M1712 Unilateral primary osteoarthritis, left knee: Secondary | ICD-10-CM

## 2021-12-26 NOTE — Progress Notes (Addendum)
Knee Injection Procedure Note  Diagnosis: left knee arthritis  Indications: Symptom relief from osteoarthritis  Anesthesia:  Topical lidocaine spray  Procedure Details  Procedure explained, risks and benefits discussed. Patient would like to proceed with the procedure. Consent obtained and will be uploaded to media file. Point of care ultrasound was used to identify the joint and no effusion was identified. The needle trajectory and depth were planned. The joint was prepped with Betadine. A 22 gauge needle was inserted into the superior aspect of the joint from a lateral approach to access the suprapatellar pouch. '20mg'$  of Triamcinolone and 77m of lidocaine were injected into the joint. The needle was removed and the area cleansed and dressed.  Complications:  None; patient tolerated the procedure well.

## 2021-12-26 NOTE — Patient Instructions (Signed)
Kimberly Copeland, Kimberly Copeland por permitirnos brindarle atencin hoy. hoy discutimos  Dolor de rodilla  Le inyectamos esteroides en la rodilla. Puede que le duela durante un da ms o menos, pero espero que su rodilla se sienta mucho mejor. Trabajaremos para conseguirle una remisin a Museum/gallery curator.  He ordenado los siguientes laboratorios para usted:  Lab Orders  No laboratory test(s) ordered today     Pruebas ordenadas hoy:  Referencias ordenadas hoy:  Referral Orders  No referral(s) requested today     He ordenado el siguiente medicamento/cambiado los siguientes medicamentos:  Suspender los siguientes medicamentos: There are no discontinued medications.   Iniciar los siguientes medicamentos: No orders of the defined types were placed in this encounter.    Seguimiento  Segn sea necesario     Si tiene alguna pregunta o inquietud, llame a la clnica de medicina interna al [phone].     Sanjuana Letters, D.O. [place]

## 2021-12-27 ENCOUNTER — Telehealth: Payer: Self-pay | Admitting: Student

## 2021-12-27 NOTE — Telephone Encounter (Signed)
Followed up after joint injection of left knee. Patient feels well and without concerns. Denies any pain at this time.

## 2021-12-27 NOTE — Progress Notes (Signed)
Internal Medicine Clinic Attending  I saw and evaluated the patient.  I personally confirmed the key portions of the history and exam documented by Dr. Johnney Ou and I reviewed pertinent patient test results.  The assessment, diagnosis, and plan were formulated together and I agree with the documentation in the resident's note. I was present for the entirety of the procedure.

## 2022-01-03 ENCOUNTER — Other Ambulatory Visit: Payer: Self-pay

## 2022-01-03 ENCOUNTER — Encounter: Payer: No Typology Code available for payment source | Admitting: Student

## 2022-01-03 DIAGNOSIS — Z1231 Encounter for screening mammogram for malignant neoplasm of breast: Secondary | ICD-10-CM

## 2022-01-10 ENCOUNTER — Encounter: Payer: No Typology Code available for payment source | Admitting: Internal Medicine

## 2022-01-11 ENCOUNTER — Encounter: Payer: No Typology Code available for payment source | Admitting: Internal Medicine

## 2022-03-22 ENCOUNTER — Ambulatory Visit: Payer: Self-pay | Admitting: Hematology and Oncology

## 2022-03-22 ENCOUNTER — Ambulatory Visit
Admission: RE | Admit: 2022-03-22 | Discharge: 2022-03-22 | Disposition: A | Discharge status: Home / Self Care | Payer: No Typology Code available for payment source | Source: Ambulatory Visit | Attending: Obstetrics and Gynecology | Admitting: Obstetrics and Gynecology

## 2022-03-22 VITALS — BP 122/82 | Wt 161.7 lb

## 2022-03-22 DIAGNOSIS — Z1231 Encounter for screening mammogram for malignant neoplasm of breast: Secondary | ICD-10-CM

## 2022-03-22 NOTE — Progress Notes (Signed)
[name] is a 46 y.o. female who presents to [facility] clinic today with no complaints .    Pap Smear: Pap not smear completed today. Last Pap smear was 11/23/19 at [facility] clinic and was normal. Per patient has no history of an abnormal Pap smear. Last Pap smear result is available in Epic.   Physical exam: Breasts Breasts symmetrical. No skin abnormalities bilateral breasts. No nipple retraction bilateral breasts. No nipple discharge bilateral breasts. No lymphadenopathy. No lumps palpated bilateral breasts.       Pelvic/Bimanual Pap is not indicated today    Smoking History: Patient has never smoked and was not referred to quit line.    Patient Navigation: Patient education provided. Access to services provided for patient through [facility] interpreter provided. No transportation provided   Colorectal Cancer Screening: Per patient has never had colonoscopy completed No complaints today.    Breast and Cervical Cancer Risk Assessment: Patient does not have family history of breast cancer, known genetic mutations, or radiation treatment to the chest before age 60. Patient does not have history of cervical dysplasia, immunocompromised, or DES exposure in-utero.  Risk Assessment   No risk assessment data for the current encounter  Risk Scores       09/22/2020   Last edited by: Demetrius Revel, LPN   5-year risk: 0.4 %   Lifetime risk: 5 %            A: [facility] exam without pap smear No complaints with benign exam.  P: Referred patient to the [facility] for a screening mammogram. Appointment scheduled 03/22/22.  Melodye Ped, NP 03/22/2022 10:47 AM

## 2022-04-03 ENCOUNTER — Emergency Department (HOSPITAL_COMMUNITY)
Admission: EM | Admit: 2022-04-03 | Discharge: 2022-04-04 | Discharge status: Against Medical Advice | Payer: No Typology Code available for payment source | Attending: Physician Assistant | Admitting: Physician Assistant

## 2022-04-03 ENCOUNTER — Other Ambulatory Visit: Payer: Self-pay

## 2022-04-03 ENCOUNTER — Encounter (HOSPITAL_COMMUNITY): Payer: Self-pay | Admitting: Emergency Medicine

## 2022-04-03 DIAGNOSIS — Z5321 Procedure and treatment not carried out due to patient leaving prior to being seen by health care provider: Secondary | ICD-10-CM | POA: Insufficient documentation

## 2022-04-03 DIAGNOSIS — R0789 Other chest pain: Secondary | ICD-10-CM | POA: Insufficient documentation

## 2022-04-03 DIAGNOSIS — F1290 Cannabis use, unspecified, uncomplicated: Secondary | ICD-10-CM | POA: Insufficient documentation

## 2022-04-03 LAB — CBC
HCT: 39.5 % (ref 36.0–46.0)
Hemoglobin: 12.3 g/dL (ref 12.0–15.0)
MCH: 26.2 pg (ref 26.0–34.0)
MCHC: 31.1 g/dL (ref 30.0–36.0)
MCV: 84.2 fL (ref 80.0–100.0)
Platelets: 278 10*3/uL (ref 150–400)
RBC: 4.69 MIL/uL (ref 3.87–5.11)
RDW: 13.8 % (ref 11.5–15.5)
WBC: 9 10*3/uL (ref 4.0–10.5)
nRBC: 0 % (ref 0.0–0.2)

## 2022-04-03 LAB — BASIC METABOLIC PANEL
Anion gap: 13 (ref 5–15)
BUN: 15 mg/dL (ref 6–20)
CO2: 19 mmol/L — ABNORMAL LOW (ref 22–32)
Calcium: 9.2 mg/dL (ref 8.9–10.3)
Chloride: 105 mmol/L (ref 98–111)
Creatinine, Ser: 0.81 mg/dL (ref 0.44–1.00)
GFR, Estimated: >60 mL/min (ref >60)
Glucose, Bld: 124 mg/dL — ABNORMAL HIGH (ref 70–99)
Potassium: 3.8 mmol/L (ref 3.5–5.1)
Sodium: 137 mmol/L (ref 135–145)

## 2022-04-03 LAB — TROPONIN I (HIGH SENSITIVITY): Troponin I (High Sensitivity): 2 ng/L (ref <18)

## 2022-04-03 NOTE — ED Triage Notes (Signed)
Patient ate CBD gummies this evening reports dry mouth , chest tightness and feeling anxious/mild agitation .

## 2022-04-03 NOTE — ED Provider Triage Note (Signed)
Emergency Medicine Provider Triage Evaluation Note  Kimberly Copeland [facility] , a 46 y.o. female  was evaluated in triage.  Pt complains of reaction to CBD Gummies.  Patient son at bedside reports that she had one 1000 mg CBD gummy earlier and is now complaining of chest pain and feeling like she cannot swallow and anxiety.  She complains of chest tightness as well.  She has taken these Gummies in the past and has not had reactions like this.  She denies any other drug or alcohol use.  Review of Systems  Positive: As above Negative: As above  Physical Exam  BP (!) 165/146 (BP Location: Right Arm)   Pulse 92   Temp 98.2 F (36.8 C) (Oral)   Resp 20   LMP 03/22/2022 (Exact Date)   SpO2 100%  Gen:   Awake, no distress   Resp:  Normal effort  MSK:   Moves extremities without difficulty  Other:  Anxious appearing, needing frequent redirection from her son and reassurance moving all 4 extremities, pacing around the room  Medical Decision Making  Medically screening exam initiated at 10:27 PM.  Appropriate orders placed.  Loreli Dollar Bayus was informed that the remainder of the evaluation will be completed by another provider, this initial triage assessment does not replace that evaluation, and the importance of remaining in the ED until their evaluation is complete.     Garald Balding, PA-C 04/03/22 2228

## 2022-04-04 LAB — TROPONIN I (HIGH SENSITIVITY): Troponin I (High Sensitivity): 3 ng/L (ref <18)

## 2022-04-04 LAB — ETHANOL: Alcohol, Ethyl (B): <10 mg/dL (ref <10)

## 2022-04-04 NOTE — ED Notes (Signed)
Patient sent access code for mychart. Patient states they will read the results there. Leaving d/t wait itme

## 2022-04-13 ENCOUNTER — Encounter: Payer: No Typology Code available for payment source | Admitting: Student

## 2022-04-19 ENCOUNTER — Encounter: Payer: Self-pay | Admitting: Student

## 2022-04-19 ENCOUNTER — Encounter: Payer: No Typology Code available for payment source | Admitting: Student

## 2023-01-15 ENCOUNTER — Ambulatory Visit: Payer: No Typology Code available for payment source

## 2023-04-08 IMAGING — MG MM DIGITAL SCREENING BILAT W/ TOMO AND CAD
8 series · 8 of 24 positions shown · non-contrast
Comparison: Previous exam(s).

CLINICAL DATA: Screening.

EXAM:
DIGITAL SCREENING BILATERAL MAMMOGRAM WITH TOMOSYNTHESIS AND CAD
TECHNIQUE: Bilateral screening digital craniocaudal and mediolateral oblique
mammograms were obtained. Bilateral screening digital breast
tomosynthesis was performed. The images were evaluated with
computer-aided detection.

[L MLO synth-2D]
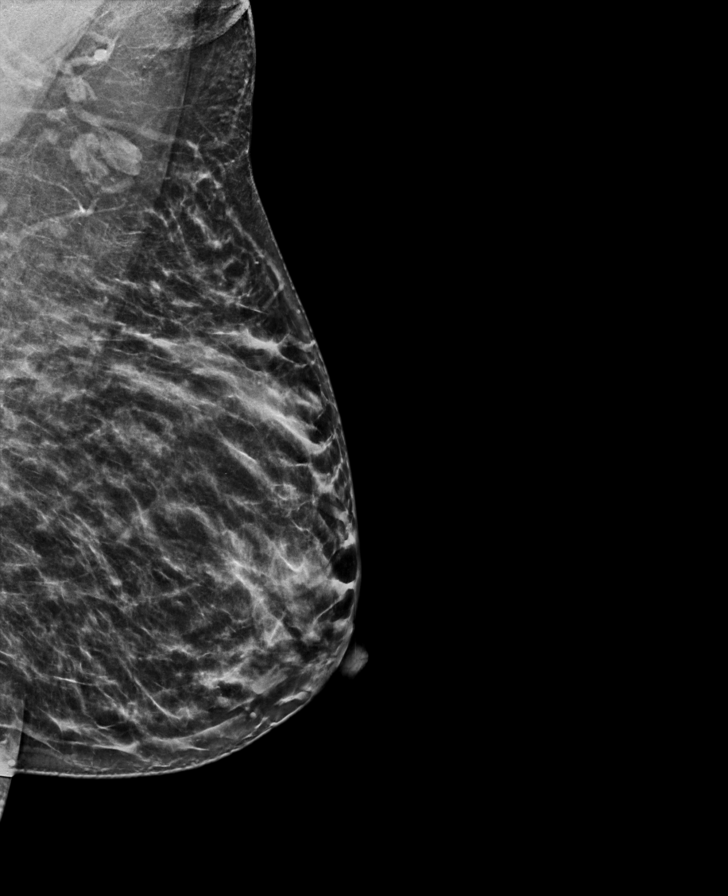

[R CC synth-2D]
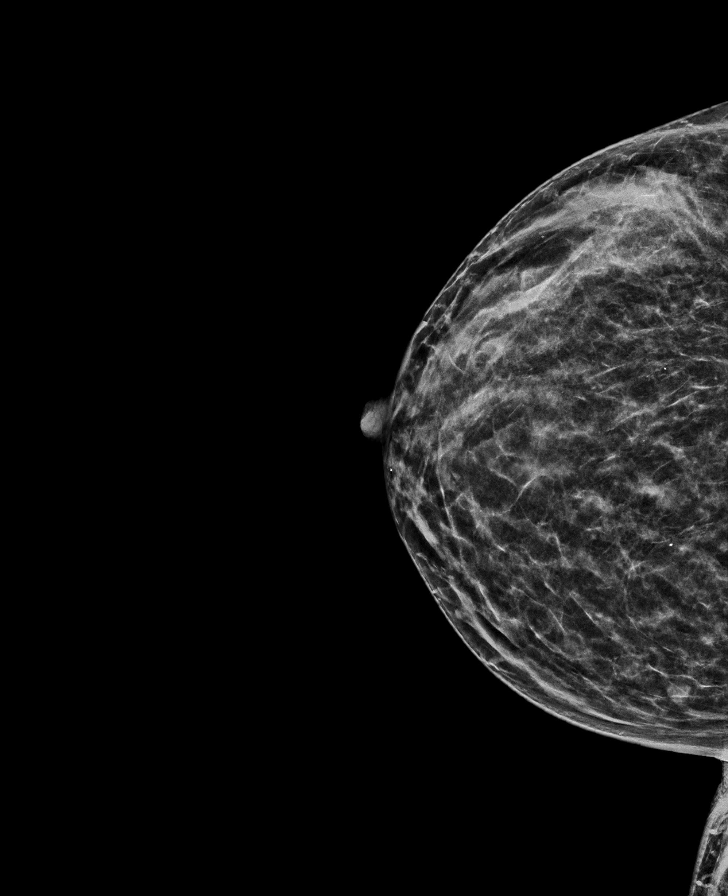

[R MLO synth-2D]
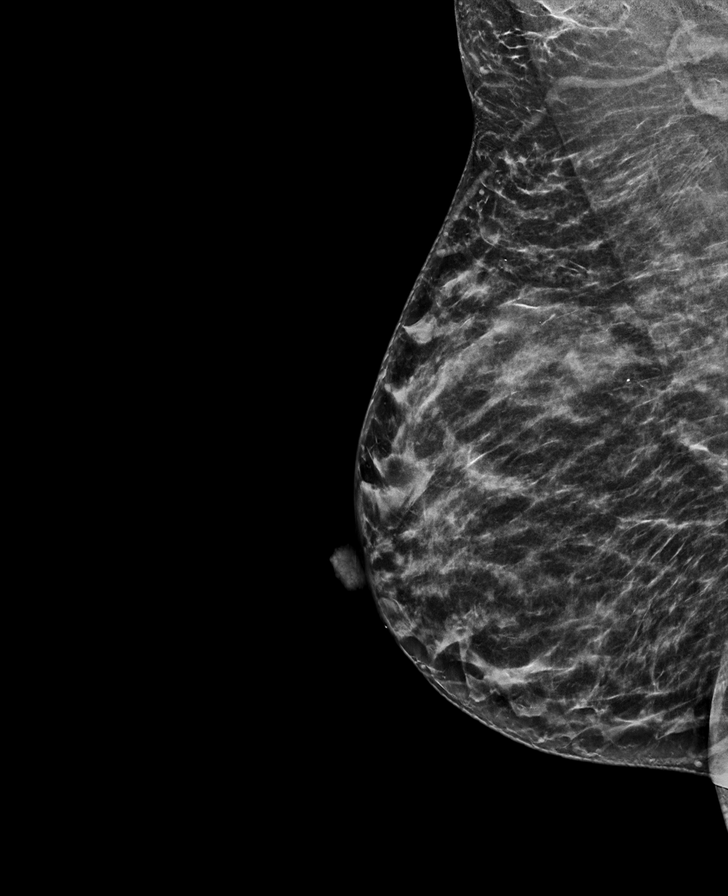

[L CC synth-2D]
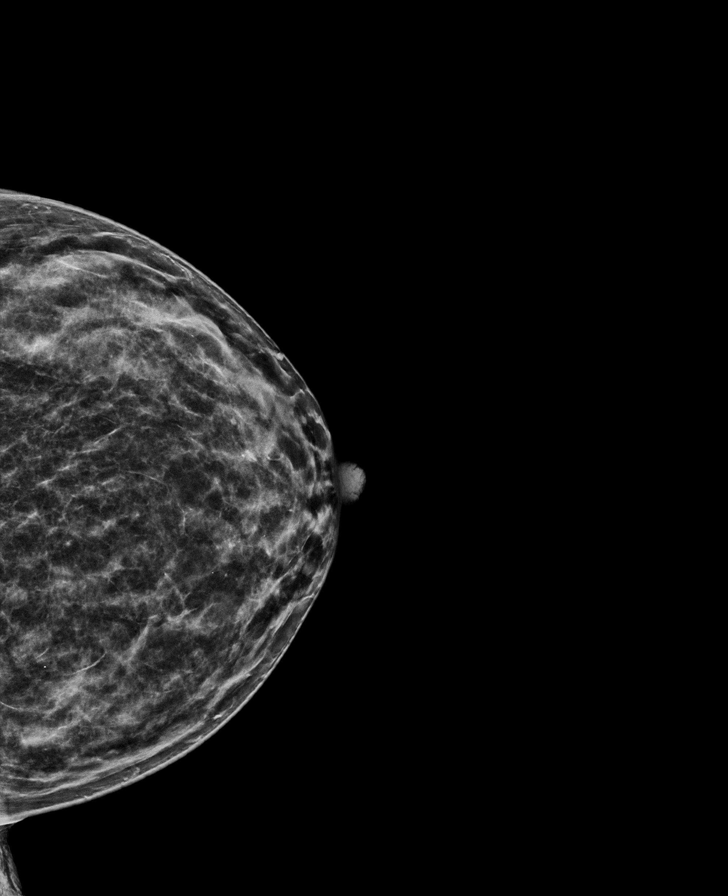

[R CC tomo · tomo slice 26/51.0]
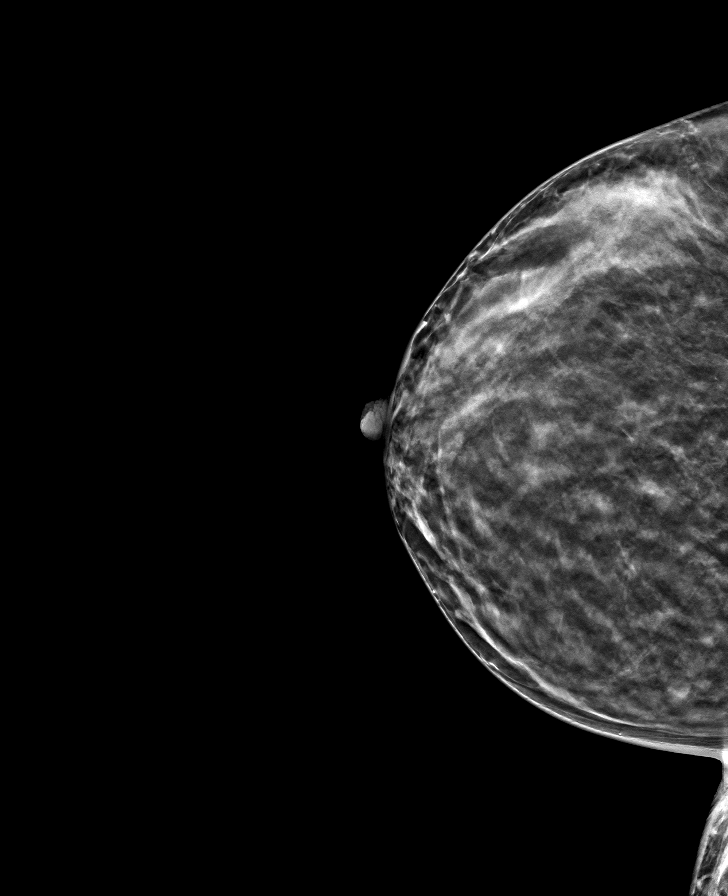

[L MLO tomo · tomo slice 33/65.0]
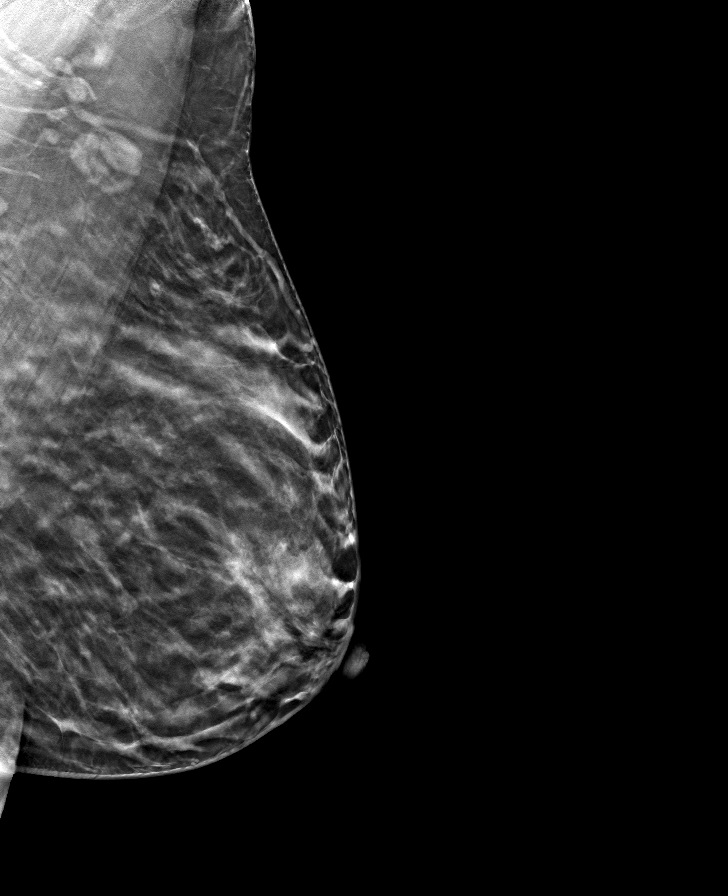

[L CC tomo · tomo slice 28/55.0]
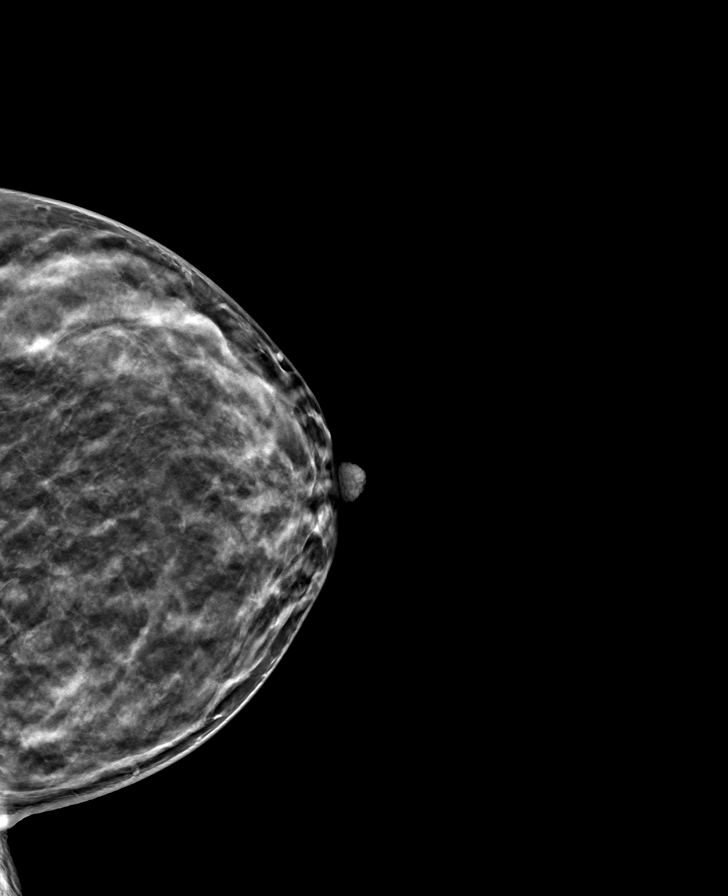

[R MLO tomo · tomo slice 29/58.0]
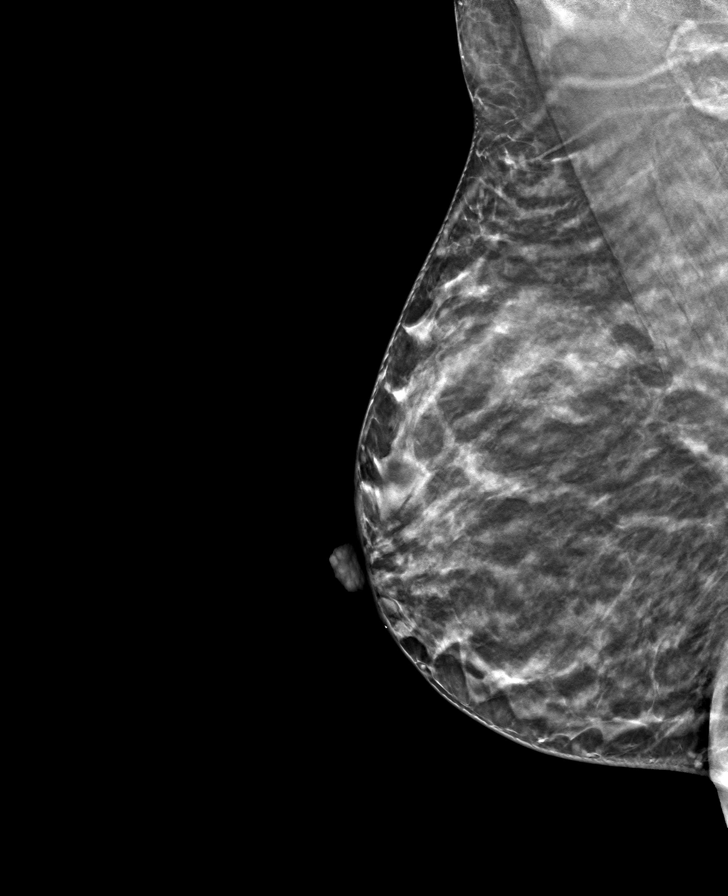

[8 of 24 positions shown; findings below may reference images not displayed]

ACR Breast Density Category c: The breast tissue is heterogeneously
dense, which may obscure small masses.
FINDINGS: There are no findings suspicious for malignancy. The images were
evaluated with computer-aided detection.
IMPRESSION: No mammographic evidence of malignancy. A result letter of this
screening mammogram will be mailed directly to the patient.

RECOMMENDATION:
Screening mammogram in one year. (Code:T4-5-GWO)

BI-RADS CATEGORY  1: Negative.

## 2023-04-23 ENCOUNTER — Encounter: Payer: Self-pay | Admitting: Internal Medicine

## 2023-07-17 ENCOUNTER — Ambulatory Visit: Payer: Self-pay | Admitting: Student

## 2023-07-17 VITALS — BP 135/75 | HR 83 | Temp 98.7°F | Ht 66.0 in | Wt 172.1 lb

## 2023-07-17 DIAGNOSIS — K219 Gastro-esophageal reflux disease without esophagitis: Secondary | ICD-10-CM

## 2023-07-17 NOTE — Patient Instructions (Addendum)
Karl Pock, Ms.Ruqaya Medrano Holdman por permitirnos brindarle atencin hoy.   He ordenado los siguientes laboratorios para usted:  Lab Orders  No laboratory test(s) ordered today     Pruebas ordenadas hoy:  Referencias ordenadas hoy:  Referral Orders  No referral(s) requested today     He ordenado el siguiente medicamento/cambiado los siguientes medicamentos:  Suspender los siguientes medicamentos: There are no discontinued medications.   Iniciar los siguientes medicamentos: No orders of the defined types were placed in this encounter.    Seguimiento: Return next week for lab only visit T, W, Th, F for H pylori breath test   Regresar la proxima semana (martes, miercoles, jueves, o viernes) para Proofreader de H pylori     Recordar:   - El costo del examen es de aproximadamente $340.20; si prefiere el examen de la popo seria alrededor de $232.05  - Favor de NO tomar los siguientes medicamentos de ahora hasta su examen: antibioticos, Pepto Bismol, omeprazole  - Favor de venir en ayunas MINIMO una hora antes de su cita para el examen   - Favor de NO tomar estos medicamentos el dia de su examen:  - Tums, Alka Seltzer, Mylanta, Milk of Magnesia, Maalox, Pepcid AC  - Yo la llamare despues de [employer] para mandarle una receta para empezar medicamentos que la ayuden con sus sintomas.    Si tiene alguna pregunta o inquietud, llame a la clnica de medicina interna al [phone].    Analiya Porco Colbert Coyer, MD PGY-1 Internal Medicine Teaching Program  [facility]

## 2023-07-19 NOTE — Progress Notes (Signed)
Established Patient Office Visit  Subjective   Patient ID: Kimberly Copeland, female    DOB: [birth year]  Age: 48 y.o. MRN: [id number]  Chief Complaint  Patient presents with   Bloated   Gastroesophageal Reflux    Pt's son states since December pt got really bad acid reflux, causing her to have upper abdominal pain and back pain. She did brought OTC medication for acid reflux, and states this really helps. Pt mention that she cannot eat spicy foods and drink soda because it will trigger acid reflux.    Patient is a 48 y.o. with a past medical history stated below who presents today for indigestion. She is accompanied by her son today. Patient received application for [employer] today. Last seen at [facility] 12/27/2022.   Please see problem based assessment and plan for additional details.    Gastroesophageal Reflux She complains of abdominal pain and heartburn. She reports no chest pain or no nausea. Pertinent negatives include no weight loss.    Past Medical History:  Diagnosis Date   Depression    GERD (gastroesophageal reflux disease)    History of precipitous labor and deliveries, antepartum    Hyperlipidemia    Intrahepatic cholestasis of pregnancy    Seasonal allergies    Review of Systems  Constitutional:  Negative for weight loss.  Respiratory:  Negative for shortness of breath.   Cardiovascular:  Negative for chest pain.  Gastrointestinal:  Positive for abdominal pain and heartburn. Negative for blood in stool, constipation, diarrhea, nausea and vomiting.       Early satiety  Genitourinary:  Negative for dysuria.     Objective:     BP 135/75 (BP Location: Right Arm, Patient Position: Sitting, Cuff Size: Normal)   Pulse 83   Temp 98.7 F (37.1 C) (Oral)   Ht 5\' 6"  (1.676 m)   Wt 172 lb 1.6 oz (78.1 kg)   SpO2 99%   BMI 27.78 kg/m  BP Readings from Last 3 Encounters:  07/17/23 135/75  04/04/22 110/65  03/22/22 122/82   Wt Readings from Last 3 Encounters:   07/17/23 172 lb 1.6 oz (78.1 kg)  03/22/22 161 lb 11.2 oz (73.3 kg)  12/26/21 166 lb (75.3 kg)   Physical Exam Constitutional:      Appearance: Normal appearance.  HENT:     Head: Normocephalic and atraumatic.     Nose: Nose normal.     Mouth/Throat:     Mouth: Mucous membranes are moist.  Cardiovascular:     Rate and Rhythm: Normal rate and regular rhythm.  Pulmonary:     Effort: Pulmonary effort is normal.     Breath sounds: Normal breath sounds.  Abdominal:     General: Bowel sounds are normal. There is no distension.     Palpations: Abdomen is soft.     Tenderness: There is abdominal tenderness.     Comments: Epigastric tenderness  Musculoskeletal:        General: Normal range of motion.  Skin:    General: Skin is warm and dry.  Neurological:     General: No focal deficit present.     Mental Status: She is alert.  Psychiatric:        Mood and Affect: Mood normal.        Behavior: Behavior normal.    No results found for any visits on 07/17/23.  Last metabolic panel Lab Results  Component Value Date   GLUCOSE 124 (H) 04/03/2022   NA 137  04/03/2022   K 3.8 04/03/2022   CL 105 04/03/2022   CO2 19 (L) 04/03/2022   BUN 15 04/03/2022   CREATININE 0.81 04/03/2022   GFRNONAA >60 04/03/2022   CALCIUM 9.2 04/03/2022   PROT 7.3 11/16/2016   ALBUMIN 4.5 11/16/2016   LABGLOB 2.8 11/16/2016   AGRATIO 1.6 11/16/2016   BILITOT 0.3 11/16/2016   ALKPHOS 70 11/16/2016   AST 18 11/16/2016   ALT 20 11/16/2016   ANIONGAP 13 04/03/2022   The 10-year ASCVD risk score (Arnett DK, et al., 2019) is: 2.3%    Assessment & Plan:   Problem List Items Addressed This Visit     Gastroesophageal reflux disease without esophagitis - Primary   Patient with history of reflux presenting with symptoms of dyspepsia for about a month. Patient endorses burning chest pain, upper abdominal tenderness that can radiate towards the back (especially after meals), and early satiety. Patient  denies nausea/vomiting, constipation, diarrhea, hematochezia, or weight loss. She has a history of appendix and gallbladder removal about 5 yeas ago. Denies previous EGD or colonoscopy. Patient got OTC omeprazole 20 mg daily which she took from January 5th to January 19th with some improvement in symptoms. Of note, patient is from [place].   Vitals here stable. On physical exam, patient with epigastric tenderness, otherwise unremarkable. Discussed possible etiology of symptoms as GERD, H pylori infection, and/or hiatal hernia. Patient willing to pay for H pylori test, opted for breath test rather than the stool antigen test. Will need to be off PPIs for 2 weeks. Patient will return to clinic next week for lab only visit. Patient given list of medications she can't take starting now and day of testing. Recommended OTC symptomatic relief. Depending on H Pylori test results, will either start quadruple therapy or PPI trial with 1 month follow up in clinic.  Plan - Lab visit 2/5 3 PM for H Pylori test, will follow up on results as needed - Symptomatic relief with OTC Mylanta - Recommend smaller meals, staying hydrated, call clinic if sudden worsening in symptoms      Relevant Orders   H pylori Breath Test   Patient discussed with Dr. Lafonda Mosses. Return in about 1 week (around 07/24/2023) for Lab visit.   Mainor Hellmann Colbert Coyer, MD

## 2023-07-19 NOTE — Assessment & Plan Note (Addendum)
Patient with history of reflux presenting with symptoms of dyspepsia for about a month. Patient endorses burning chest pain, upper abdominal tenderness that can radiate towards the back (especially after meals), and early satiety. Patient denies nausea/vomiting, constipation, diarrhea, hematochezia, or weight loss. She has a history of appendix and gallbladder removal about 5 yeas ago. Denies previous EGD or colonoscopy. Patient got OTC omeprazole 20 mg daily which she took from January 5th to January 19th with some improvement in symptoms. Of note, patient is from [place].   Vitals here stable. On physical exam, patient with epigastric tenderness, otherwise unremarkable. Discussed possible etiology of symptoms as GERD, H pylori infection, and/or hiatal hernia. Patient willing to pay for H pylori test, opted for breath test rather than the stool antigen test. Will need to be off PPIs for 2 weeks. Patient will return to clinic next week for lab only visit. Patient given list of medications she can't take starting now and day of testing. Recommended OTC symptomatic relief. Depending on H Pylori test results, will either start quadruple therapy or PPI trial with 1 month follow up in clinic.  Plan - Lab visit 2/5 3 PM for H Pylori test, will follow up on results as needed - Symptomatic relief with OTC Mylanta - Recommend smaller meals, staying hydrated, call clinic if sudden worsening in symptoms

## 2023-07-22 NOTE — Progress Notes (Signed)
 Internal Medicine Clinic Attending  Case discussed with the resident at the time of the visit.  We reviewed the resident's history and exam and pertinent patient test results.  I agree with the assessment, diagnosis, and plan of care documented in the resident's note.

## 2023-07-23 ENCOUNTER — Telehealth: Payer: Self-pay | Admitting: *Deleted

## 2023-07-23 NOTE — Telephone Encounter (Addendum)
 Call from pt's son, [place]borne, to cancel lab appt for tomorrow (for Hpyloric breath test). Stated he thinks pt had the flu, feeling better today, but pt did take Ibuprofen  and Acetaminophen  in which pt was told not to take for the test. So the son wants to know when to re-schedule the lab appt.? I told him I will ask the doctor.SABRA

## 2023-07-24 ENCOUNTER — Other Ambulatory Visit: Payer: No Typology Code available for payment source

## 2023-07-24 NOTE — Telephone Encounter (Signed)
 Pt has been added to lab schedule on Friday.

## 2023-07-26 ENCOUNTER — Other Ambulatory Visit: Payer: Self-pay

## 2023-07-26 DIAGNOSIS — K219 Gastro-esophageal reflux disease without esophagitis: Secondary | ICD-10-CM

## 2023-07-26 NOTE — Progress Notes (Signed)
 07-26-2023  Patient Kimberly Copeland is spanish speaking.  Video Interpretor used during her lab visit on 07-26-23 at 11:18  Alverna [id number] Alverna assisted with introduction, patient identification and reviewing preliminary test questions per [facility] test requirements.  Patient did eat this morning at 1035. She agreed to wait until she had passed the !hr fasting mark.  Disconnected with Alverna.  At 11:49 Video interpretor Angelica [id number] joined us  we could continue test.  Angelica assisted with instructions during the breath test.  Angelica provided interpreting service until completion of lab visit  [place], PBT [facility] Clinic Lab 07-26-23 12:25

## 2023-07-28 LAB — H. PYLORI BREATH TEST: H pylori Breath Test: POSITIVE — AB

## 2023-07-30 ENCOUNTER — Telehealth: Payer: Self-pay | Admitting: Student

## 2023-07-30 DIAGNOSIS — A048 Other specified bacterial intestinal infections: Secondary | ICD-10-CM

## 2023-07-30 MED ORDER — OMEPRAZOLE MAGNESIUM 20 MG PO TBEC: 20.0000 mg | DELAYED_RELEASE_TABLET | Freq: Two times a day (BID) | ORAL | 0 refills | Status: AC

## 2023-07-30 MED ORDER — BISMUTH/METRONIDAZ/TETRACYCLIN 140-125-125 MG PO CAPS: 3.0000 | ORAL_CAPSULE | Freq: Three times a day (TID) | ORAL | 0 refills | Status: AC

## 2023-07-30 NOTE — Telephone Encounter (Signed)
Spoke to pt about H. Pylori results. Quadruple therapy for 14 days sent to pharmacy.

## 2023-08-15 ENCOUNTER — Telehealth: Payer: Self-pay

## 2023-08-15 NOTE — Telephone Encounter (Signed)
 Pt son is requesting a call back ... He stated his mom is having seasonal allergies and she is wanting to know what can she take for them

## 2023-10-29 ENCOUNTER — Other Ambulatory Visit: Payer: Self-pay | Admitting: Obstetrics & Gynecology

## 2023-10-29 DIAGNOSIS — Z1231 Encounter for screening mammogram for malignant neoplasm of breast: Secondary | ICD-10-CM

## 2023-11-07 ENCOUNTER — Encounter

## 2024-04-02 ENCOUNTER — Encounter

## 2024-04-07 ENCOUNTER — Ambulatory Visit
Admission: RE | Admit: 2024-04-07 | Discharge: 2024-04-07 | Disposition: A | Discharge status: Home / Self Care | Source: Ambulatory Visit | Attending: Obstetrics & Gynecology | Admitting: Obstetrics & Gynecology

## 2024-04-07 DIAGNOSIS — Z1231 Encounter for screening mammogram for malignant neoplasm of breast: Secondary | ICD-10-CM

## 2024-04-10 ENCOUNTER — Ambulatory Visit: Payer: Self-pay | Admitting: Obstetrics & Gynecology
